# Patient Record
Sex: Female | Born: 1995 | Race: White | Hispanic: No | Marital: Single | Smoking: Never smoker
Health system: Southern US, Community
[De-identification: ages and names within clinical notes are randomized; demographics above are authoritative.]

## PROBLEM LIST (undated history)

## (undated) DIAGNOSIS — F419 Anxiety disorder, unspecified: Secondary | ICD-10-CM

## (undated) HISTORY — PX: TONSILLECTOMY: SUR1361

## (undated) HISTORY — PX: WISDOM TOOTH EXTRACTION: SHX21

## (undated) HISTORY — DX: Anxiety disorder, unspecified: F41.9

---
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
## 2017-07-07 IMAGING — CT CT NECK W/ CM
4 of 5 series · 16 of 33 positions shown, 18 images · IV contrast (OMNIPAQUE 300)
Comparison: None.

CLINICAL DATA: Chronic right-sided neck pain for 1 month. Assess
for abscess. Initial encounter.

EXAM:
CT NECK WITH CONTRAST
TECHNIQUE: Multidetector CT imaging of the neck was performed using the
standard protocol following the bolus administration of intravenous
contrast.
CONTRAST:  75mL OMNIPAQUE IOHEXOL 300 MG/ML  SOLN

[Series 2: neck with st · axial · 0.45mm/px · z∈[-208,-64]mm · 4 of 120 slices shown, 5 images]
[im 24/120  soft-tissue]
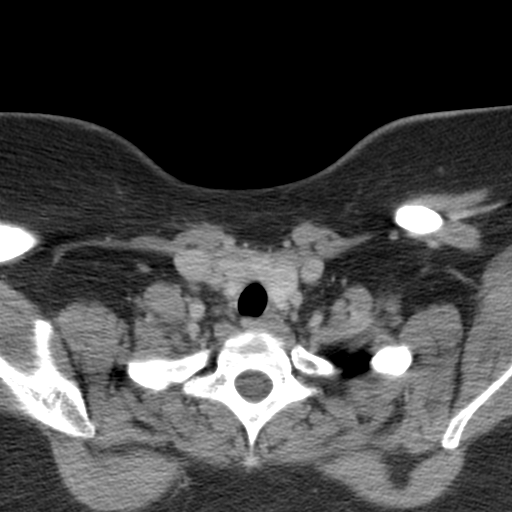
[im 24/120  bone]
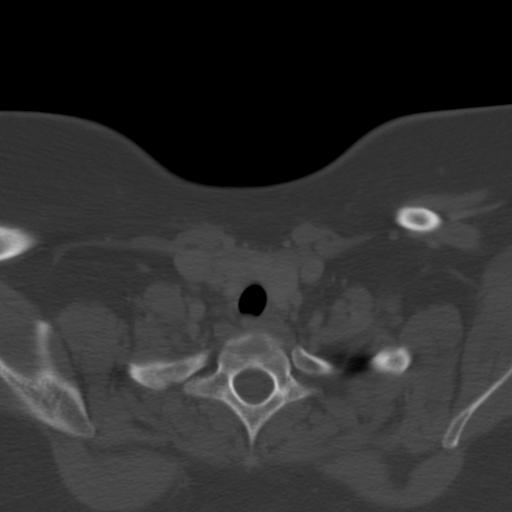
[im 48/120  bone]
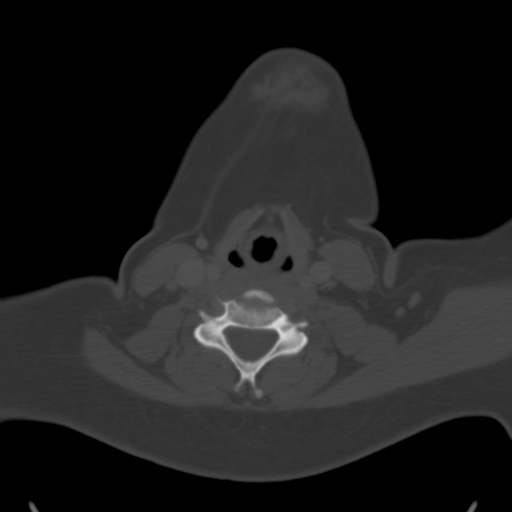
[im 72/120  bone]
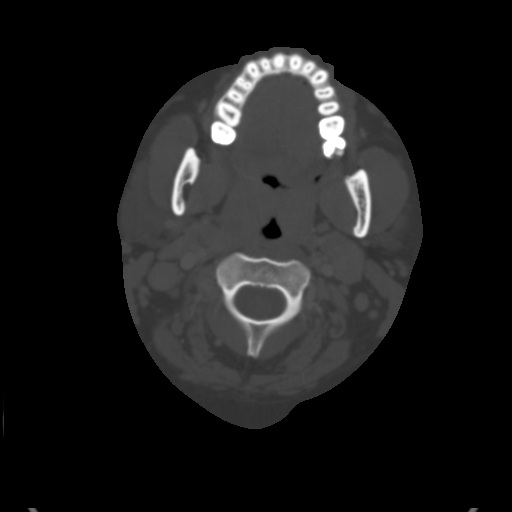
[im 96/120  bone]
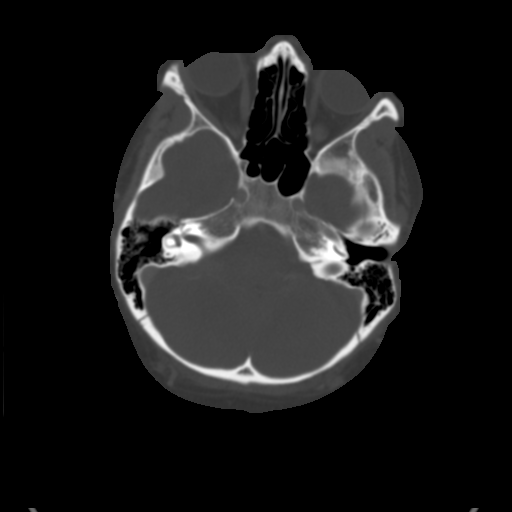

[Series 5: coronal st · coronal · 0.52mm/px · 3 of 101 slices shown]
[im 21/101  bone]
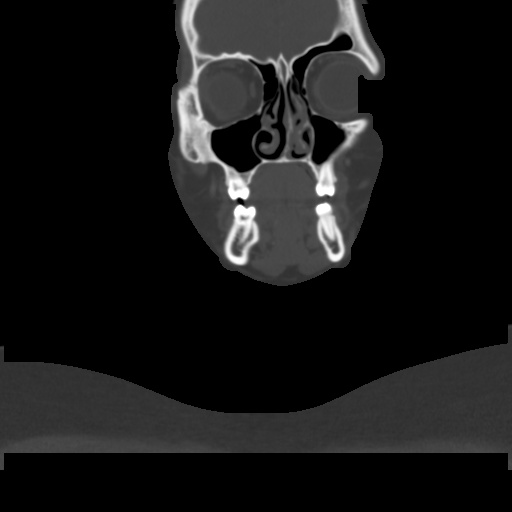
[im 41/101  bone]
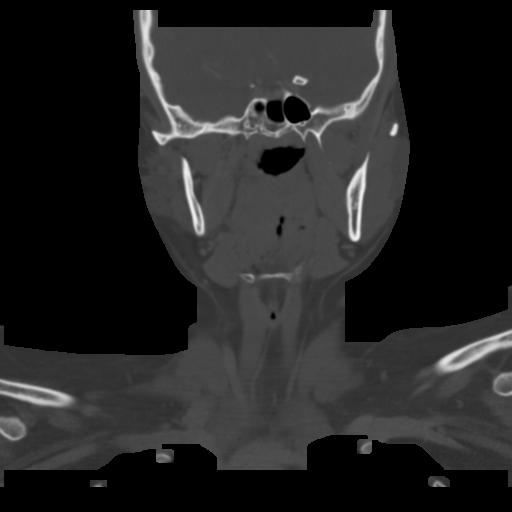
[im 61/101  bone]
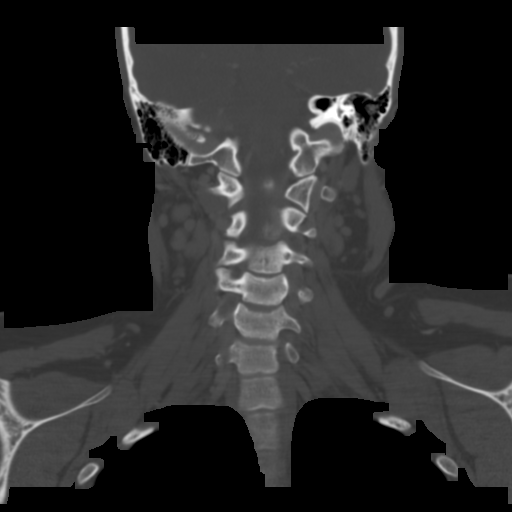

[Series 6: sagittal st · sagittal · 0.52mm/px · 5 of 95 slices shown, 6 images]
[im 32/95  bone]
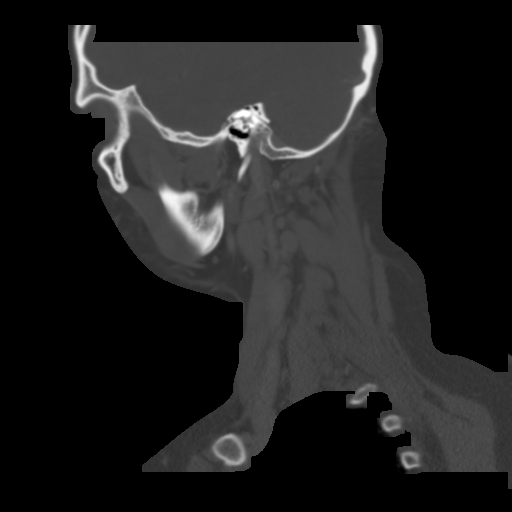
[im 40/95  bone]
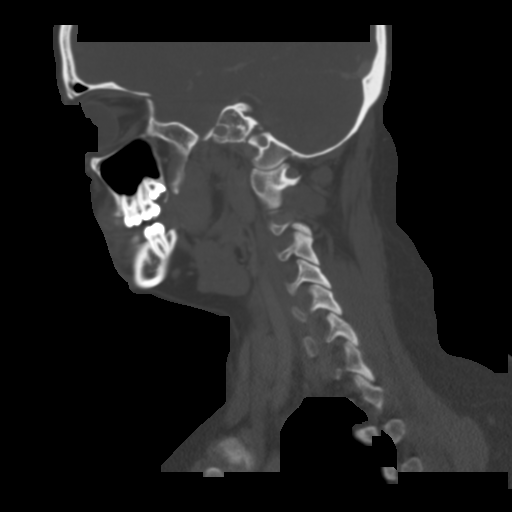
[im 48/95  soft-tissue]
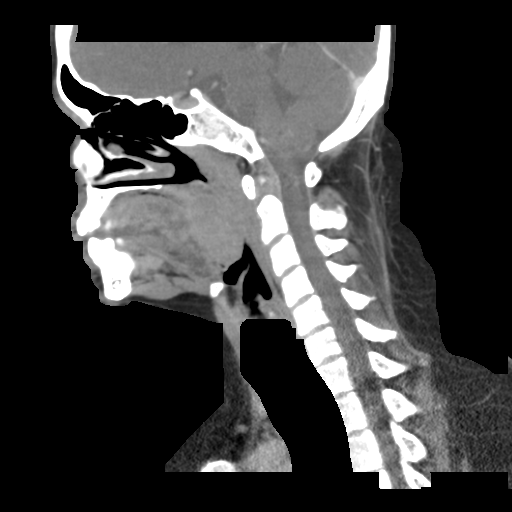
[im 48/95  bone]
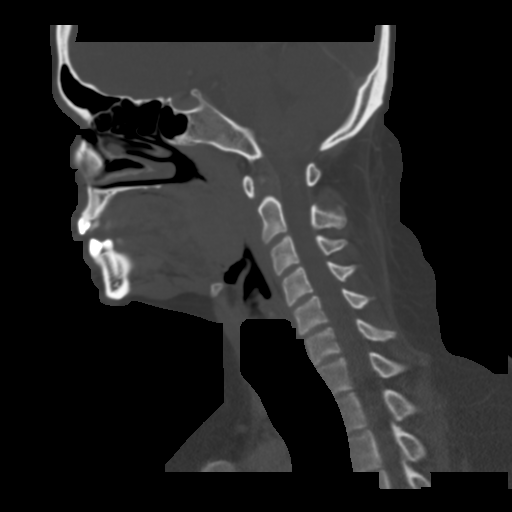
[im 55/95  bone]
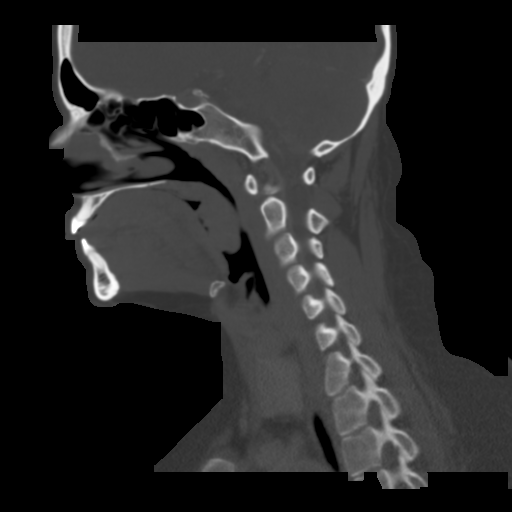
[im 63/95  bone]
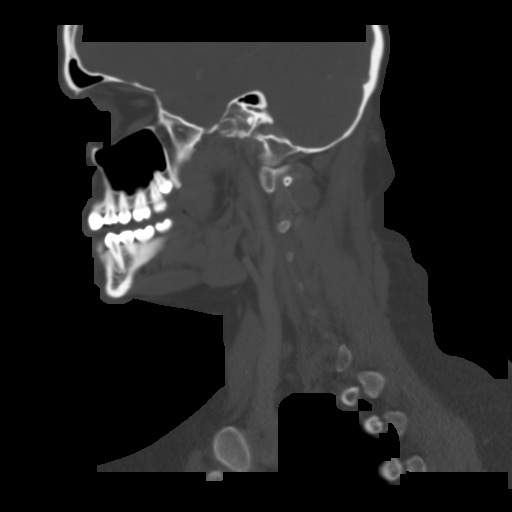

[Series 7: axial recons · axial · 0.39mm/px · z∈[-226,-86]mm · 4 of 119 slices shown]
[im 24/119  bone]
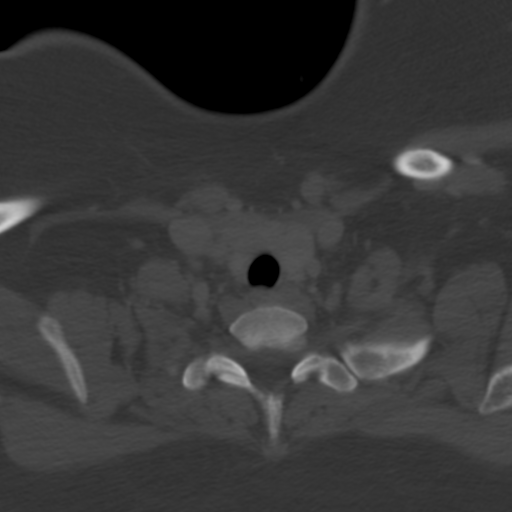
[im 48/119  bone]
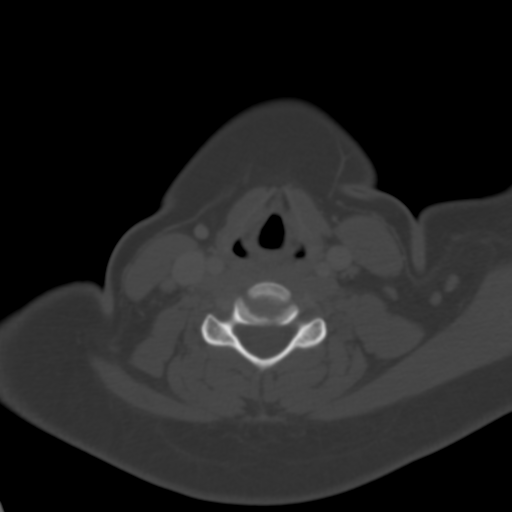
[im 71/119  bone]
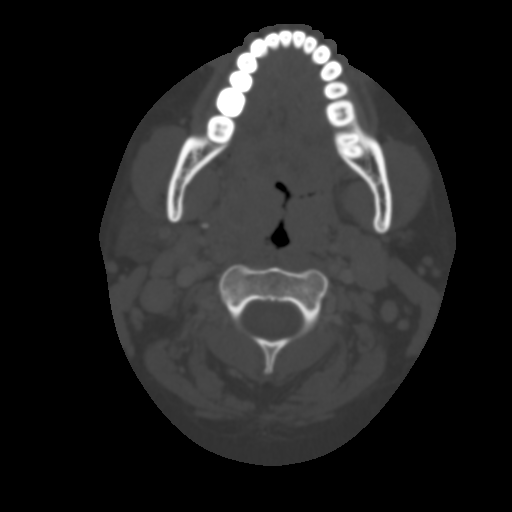
[im 95/119  bone]
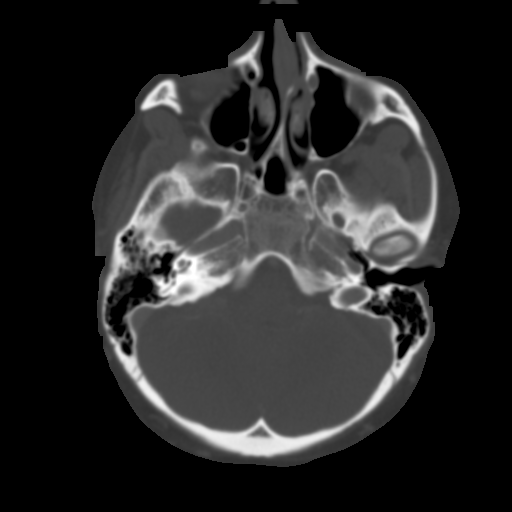

[16 of 33 positions shown; findings below may reference images not displayed]

FINDINGS: Pharynx and larynx: There is asymmetric enlargement of the right
palatine tonsil, with diffuse edema. A more focal 1.1 cm focus of
decreased attenuation is noted deep within the right palatine
tonsil, possibly reflecting a small evolving abscess. Edema tracks
inferiorly along the right hypopharynx. The overlying parapharyngeal
fat planes are preserved. There is some degree of mass effect on the
oropharynx and hypopharynx.

Salivary glands: The parotid and submandibular glands are symmetric
and unremarkable in appearance.

Thyroid: The thyroid gland is unremarkable in appearance.

Lymph nodes: Mildly prominent bilateral cervical nodes are seen,
measuring up to 1.3 cm in short axis.

Vascular: The visualized vasculature is unremarkable in appearance.
There is no evidence of vascular compromise.

Limited intracranial: The visualized portion of the brain are
unremarkable in appearance.

Visualized orbits: The visualized portions of the orbits are grossly
unremarkable.

Mastoids and visualized paranasal sinuses: The paranasal sinuses and
mastoid air cells are well-aerated.

Skeleton: No acute osseous abnormalities are identified.

Upper chest: The visualized lung apices are grossly clear. The
superior mediastinum is grossly unremarkable in appearance.
IMPRESSION: 1. Asymmetric enlargement of the right palatine tonsil, with diffuse
soft tissue edema. More focal 1.1 cm focus of decreased attenuation
deep within the right palatine tonsil, possibly reflecting a small
evolving abscess. Edema tracks inferiorly along the right
hypopharynx. Some degree of mass effect noted on the oropharynx and
hypopharynx.
2. Mildly prominent bilateral cervical nodes, measuring up to 1.3 cm
in short axis.

## 2017-10-04 ENCOUNTER — Ambulatory Visit: Payer: Commercial Managed Care - PPO | Admitting: Women's Health

## 2017-10-04 ENCOUNTER — Encounter: Payer: Self-pay | Admitting: Women's Health

## 2017-10-04 VITALS — BP 132/80 | Ht 66.0 in | Wt 282.0 lb

## 2017-10-04 DIAGNOSIS — R5383 Other fatigue: Secondary | ICD-10-CM

## 2017-10-04 DIAGNOSIS — Z23 Encounter for immunization: Secondary | ICD-10-CM | POA: Diagnosis not present

## 2017-10-04 DIAGNOSIS — Z01419 Encounter for gynecological examination (general) (routine) without abnormal findings: Secondary | ICD-10-CM

## 2017-10-04 NOTE — Progress Notes (Signed)
Kathy Hensley 1995/11/27 [id number]    History: 22 yo SWF G0 presents for new patient annual exam and contraception consult, complains of fatigue. Interested in [employer] [facility] or [facility].  Sexually active, one partner, using condoms. Chlamydia/Gonorhea screen 1 month ago, negative at campus [facility], no new partner since. Denies need for STD screen today. Monthly 26-30 day cycles, moderate bleeding, menstruation lasts 5 days, uses diva cup.  Received 1st dose of guardasil, time unknown, but did not receive 2nd or 3rd. Has been obese for over 5 years, notes little exercise, intimidation at the gym, and poor diet.   Past medical history, past surgical history, family history and social history were all reviewed and documented in the EPIC chart. No notable medical history, not currently taking any medications. Father hx of thyroid ablation and diabetes. Paternal aunt has Graves Disease. Junior at [employer] graduating in December with political science degree. Plans to attend law school.  ROS:  A ROS was performed and pertinent positives and negatives are included.  Exam:   Vitals:   10/04/17 1200  BP: 132/80  Weight: 282 lb (127.9 kg)  Height: 5\' 6"  (1.676 m)   Body mass index is 45.52 kg/m.   General appearance:  Normal, appears well. Thyroid:  Symmetrical, normal in size, without palpable masses or nodularity. Respiratory  Auscultation:  Clear without wheezing or rhonchi Cardiovascular  Auscultation:  Regular rate, without rubs, murmurs or gallops  Edema/varicosities:  Not grossly evident Abdominal  Soft,nontender, without masses, guarding or rebound.  Liver/spleen:  No organomegaly noted  Hernia:  None appreciated  Skin  Inspection:  Grossly normal   Breasts: Examined lying and sitting.     Right: Without masses, retractions, discharge or axillary adenopathy.     Left: Without masses, retractions, discharge or axillary adenopathy. Gentitourinary   Inguinal/mons:  Normal without inguinal  adenopathy  External genitalia:  Normal  BUS/Urethra/Skene's glands:  Normal  Vagina:  Normal  Cervix:  Normal  Uterus:  normal in size, shape and contour.  Midline and mobile  Adnexa/parametria:     Rt: Without masses or tenderness.   Lt: Without masses or tenderness.  Anus and perineum: Normal  Digital rectal exam: Not done  Assessment/Plan:  22 y.o. G0 SWF for new patient annual exam.   Contraception management Gardasil Obesity  Plan: Reviewed options and side effects for contraception, agreed to Mirena IUD, will schedule insertion for next menstruation. IUD risks of infection, perforation and hemorrhage reviewed. Reviewed may have some irregular bleeding first month. Received 2nd Gardisil shot today, scheduled for 3rd in 4 months. Counseled patient to receive first Pap next year. Counseled  on SBE's, healthy diet, exercise, weight loss, and campus safety. Instructed to call if she has any problems or decides against the Mirena IUD. CBC, Glucose, TSH   Harrington Challengerancy J Young [facility], 12:26 PM 10/04/2017

## 2017-10-04 NOTE — Patient Instructions (Signed)
Carbohydrate Counting for Diabetes Mellitus, Adult Carbohydrate counting is a method for keeping track of how many carbohydrates you eat. Eating carbohydrates naturally increases the amount of sugar (glucose) in the blood. Counting how many carbohydrates you eat helps keep your blood glucose within normal limits, which helps you manage your diabetes (diabetes mellitus). It is important to know how many carbohydrates you can safely have in each meal. This is different for every person. A diet and nutrition specialist (registered dietitian) can help you make a meal plan and calculate how many carbohydrates you should have at each meal and snack. Carbohydrates are found in the following foods:  Grains, such as breads and cereals.  Dried beans and soy products.  Starchy vegetables, such as potatoes, peas, and corn.  Fruit and fruit juices.  Milk and yogurt.  Sweets and snack foods, such as cake, cookies, candy, chips, and soft drinks.  How do I count carbohydrates? There are two ways to count carbohydrates in food. You can use either of the methods or a combination of both. Reading "Nutrition Facts" on packaged food The "Nutrition Facts" list is included on the labels of almost all packaged foods and beverages in the [place] It includes:  The serving size.  Information about nutrients in each serving, including the grams (g) of carbohydrate per serving.  To use the "Nutrition Facts":  Decide how many servings you will have.  Multiply the number of servings by the number of carbohydrates per serving.  The resulting number is the total amount of carbohydrates that you will be having.  Learning standard serving sizes of other foods When you eat foods containing carbohydrates that are not packaged or do not include "Nutrition Facts" on the label, you need to measure the servings in order to count the amount of carbohydrates:  Measure the foods that you will eat with a food scale or  measuring cup, if needed.  Decide how many standard-size servings you will eat.  Multiply the number of servings by 15. Most carbohydrate-rich foods have about 15 g of carbohydrates per serving. ? For example, if you eat 8 oz (170 g) of strawberries, you will have eaten 2 servings and 30 g of carbohydrates (2 servings x 15 g = 30 g).  For foods that have more than one food mixed, such as soups and casseroles, you must count the carbohydrates in each food that is included.  The following list contains standard serving sizes of common carbohydrate-rich foods. Each of these servings has about 15 g of carbohydrates:   hamburger bun or  English muffin.   oz (15 mL) syrup.   oz (14 g) jelly.  1 slice of bread.  1 six-inch tortilla.  3 oz (85 g) cooked rice or pasta.  4 oz (113 g) cooked dried beans.  4 oz (113 g) starchy vegetable, such as peas, corn, or potatoes.  4 oz (113 g) hot cereal.  4 oz (113 g) mashed potatoes or  of a large baked potato.  4 oz (113 g) canned or frozen fruit.  4 oz (120 mL) fruit juice.  4-6 crackers.  6 chicken nuggets.  6 oz (170 g) unsweetened dry cereal.  6 oz (170 g) plain fat-free yogurt or yogurt sweetened with artificial sweeteners.  8 oz (240 mL) milk.  8 oz (170 g) fresh fruit or one small piece of fruit.  24 oz (680 g) popped popcorn.  Example of carbohydrate counting Sample meal  3 oz (85 g) chicken breast.    6 oz (170 g) brown rice.  4 oz (113 g) corn.  8 oz (240 mL) milk.  8 oz (170 g) strawberries with sugar-free whipped topping. Carbohydrate calculation 1. Identify the foods that contain carbohydrates: ? Rice. ? Corn. ? Milk. ? Strawberries. 2. Calculate how many servings you have of each food: ? 2 servings rice. ? 1 serving corn. ? 1 serving milk. ? 1 serving strawberries. 3. Multiply each number of servings by 15 g: ? 2 servings rice x 15 g = 30 g. ? 1 serving corn x 15 g = 15 g. ? 1 serving milk x 15  g = 15 g. ? 1 serving strawberries x 15 g = 15 g. 4. Add together all of the amounts to find the total grams of carbohydrates eaten: ? 30 g + 15 g + 15 g + 15 g = 75 g of carbohydrates total. This information is not intended to replace advice given to you by your health care provider. Make sure you discuss any questions you have with your health care provider. Document Released: 08/17/2005 Document Revised: 03/06/2016 Document Reviewed: 01/29/2016 Elsevier Interactive Patient Education  2018 [place] Maintenance, Female Adopting a healthy lifestyle and getting preventive care can go a long way to promote health and wellness. Talk with your health care provider about what schedule of regular examinations is right for you. This is a good chance for you to check in with your provider about disease prevention and staying healthy. In between checkups, there are plenty of things you can do on your own. Experts have done a lot of research about which lifestyle changes and preventive measures are most likely to keep you healthy. Ask your health care provider for more information. Weight and diet Eat a healthy diet  Be sure to include plenty of vegetables, fruits, low-fat dairy products, and lean protein.  Do not eat a lot of foods high in solid fats, added sugars, or salt.  Get regular exercise. This is one of the most important things you can do for your health. ? Most adults should exercise for at least 150 minutes each week. The exercise should increase your heart rate and make you sweat (moderate-intensity exercise). ? Most adults should also do strengthening exercises at least twice a week. This is in addition to the moderate-intensity exercise.  Maintain a healthy weight  Body mass index (BMI) is a measurement that can be used to identify possible weight problems. It estimates body fat based on height and weight. Your health care provider can help determine your BMI and help you  achieve or maintain a healthy weight.  For females 76 years of age and older: ? A BMI below 18.5 is considered underweight. ? A BMI of 18.5 to 24.9 is normal. ? A BMI of 25 to 29.9 is considered overweight. ? A BMI of 30 and above is considered obese.  Watch levels of cholesterol and blood lipids  You should start having your blood tested for lipids and cholesterol at 21 years of age, then have this test every 5 years.  You may need to have your cholesterol levels checked more often if: ? Your lipid or cholesterol levels are high. ? You are older than 21 years of age. ? You are at high risk for heart disease.  Cancer screening Lung Cancer  Lung cancer screening is recommended for adults 52-25 years old who are at high risk for lung cancer because of a history of smoking.  A  yearly low-dose CT scan of the lungs is recommended for people who: ? Currently smoke. ? Have quit within the past 15 years. ? Have at least a 30-pack-year history of smoking. A pack year is smoking an average of one pack of cigarettes a day for 1 year.  Yearly screening should continue until it has been 15 years since you quit.  Yearly screening should stop if you develop a health problem that would prevent you from having lung cancer treatment.  Breast Cancer  Practice breast self-awareness. This means understanding how your breasts normally appear and feel.  It also means doing regular breast self-exams. Let your health care provider know about any changes, no matter how small.  If you are in your 20s or 30s, you should have a clinical breast exam (CBE) by a health care provider every 1-3 years as part of a regular health exam.  If you are 4 or older, have a CBE every year. Also consider having a breast X-ray (mammogram) every year.  If you have a family history of breast cancer, talk to your health care provider about genetic screening.  If you are at high risk for breast cancer, talk to your health  care provider about having an MRI and a mammogram every year.  Breast cancer gene (BRCA) assessment is recommended for women who have family members with BRCA-related cancers. BRCA-related cancers include: ? Breast. ? Ovarian. ? Tubal. ? Peritoneal cancers.  Results of the assessment will determine the need for genetic counseling and BRCA1 and BRCA2 testing.  Cervical Cancer Your health care provider may recommend that you be screened regularly for cancer of the pelvic organs (ovaries, uterus, and vagina). This screening involves a pelvic examination, including checking for microscopic changes to the surface of your cervix (Pap test). You may be encouraged to have this screening done every 3 years, beginning at age 12.  For women ages 13-65, health care providers may recommend pelvic exams and Pap testing every 3 years, or they may recommend the Pap and pelvic exam, combined with testing for human papilloma virus (HPV), every 5 years. Some types of HPV increase your risk of cervical cancer. Testing for HPV may also be done on women of any age with unclear Pap test results.  Other health care providers may not recommend any screening for nonpregnant women who are considered low risk for pelvic cancer and who do not have symptoms. Ask your health care provider if a screening pelvic exam is right for you.  If you have had past treatment for cervical cancer or a condition that could lead to cancer, you need Pap tests and screening for cancer for at least 20 years after your treatment. If Pap tests have been discontinued, your risk factors (such as having a new sexual partner) need to be reassessed to determine if screening should resume. Some women have medical problems that increase the chance of getting cervical cancer. In these cases, your health care provider may recommend more frequent screening and Pap tests.  Colorectal Cancer  This type of cancer can be detected and often  prevented.  Routine colorectal cancer screening usually begins at 22 years of age and continues through 22 years of age.  Your health care provider may recommend screening at an earlier age if you have risk factors for colon cancer.  Your health care provider may also recommend using home test kits to check for hidden blood in the stool.  A small camera at the end of  a tube can be used to examine your colon directly (sigmoidoscopy or colonoscopy). This is done to check for the earliest forms of colorectal cancer.  Routine screening usually begins at age 45.  Direct examination of the colon should be repeated every 5-10 years through 21 years of age. However, you may need to be screened more often if early forms of precancerous polyps or small growths are found.  Skin Cancer  Check your skin from head to toe regularly.  Tell your health care provider about any new moles or changes in moles, especially if there is a change in a mole's shape or color.  Also tell your health care provider if you have a mole that is larger than the size of a pencil eraser.  Always use sunscreen. Apply sunscreen liberally and repeatedly throughout the day.  Protect yourself by wearing long sleeves, pants, a wide-brimmed hat, and sunglasses whenever you are outside.  Heart disease, diabetes, and high blood pressure  High blood pressure causes heart disease and increases the risk of stroke. High blood pressure is more likely to develop in: ? People who have blood pressure in the high end of the normal range (130-139/85-89 mm Hg). ? People who are overweight or obese. ? People who are African American.  If you are 65-44 years of age, have your blood pressure checked every 3-5 years. If you are 35 years of age or older, have your blood pressure checked every year. You should have your blood pressure measured twice-once when you are at a hospital or clinic, and once when you are not at a hospital or clinic.  Record the average of the two measurements. To check your blood pressure when you are not at a hospital or clinic, you can use: ? An automated blood pressure machine at a pharmacy. ? A home blood pressure monitor.  If you are between 55 years and 4 years old, ask your health care provider if you should take aspirin to prevent strokes.  Have regular diabetes screenings. This involves taking a blood sample to check your fasting blood sugar level. ? If you are at a normal weight and have a low risk for diabetes, have this test once every three years after 21 years of age. ? If you are overweight and have a high risk for diabetes, consider being tested at a younger age or more often. Preventing infection Hepatitis B  If you have a higher risk for hepatitis B, you should be screened for this virus. You are considered at high risk for hepatitis B if: ? You were born in a country where hepatitis B is common. Ask your health care provider which countries are considered high risk. ? Your parents were born in a high-risk country, and you have not been immunized against hepatitis B (hepatitis B vaccine). ? You have HIV or AIDS. ? You use needles to inject street drugs. ? You live with someone who has hepatitis B. ? You have had sex with someone who has hepatitis B. ? You get hemodialysis treatment. ? You take certain medicines for conditions, including cancer, organ transplantation, and autoimmune conditions.  Hepatitis C  Blood testing is recommended for: ? Everyone born from [birth year] through 1964. ? Anyone with known risk factors for hepatitis C.  Sexually transmitted infections (STIs)  You should be screened for sexually transmitted infections (STIs) including gonorrhea and chlamydia if: ? You are sexually active and are younger than 21 years of age. ? You are older than  21 years of age and your health care provider tells you that you are at risk for this type of infection. ? Your sexual  activity has changed since you were last screened and you are at an increased risk for chlamydia or gonorrhea. Ask your health care provider if you are at risk.  If you do not have HIV, but are at risk, it may be recommended that you take a prescription medicine daily to prevent HIV infection. This is called pre-exposure prophylaxis (PrEP). You are considered at risk if: ? You are sexually active and do not regularly use condoms or know the HIV status of your partner(s). ? You take drugs by injection. ? You are sexually active with a partner who has HIV.  Talk with your health care provider about whether you are at high risk of being infected with HIV. If you choose to begin PrEP, you should first be tested for HIV. You should then be tested every 3 months for as long as you are taking PrEP. Pregnancy  If you are premenopausal and you may become pregnant, ask your health care provider about preconception counseling.  If you may become pregnant, take 400 to 800 micrograms (mcg) of folic acid every day.  If you want to prevent pregnancy, talk to your health care provider about birth control (contraception). Osteoporosis and menopause  Osteoporosis is a disease in which the bones lose minerals and strength with aging. This can result in serious bone fractures. Your risk for osteoporosis can be identified using a bone density scan.  If you are 29 years of age or older, or if you are at risk for osteoporosis and fractures, ask your health care provider if you should be screened.  Ask your health care provider whether you should take a calcium or vitamin D supplement to lower your risk for osteoporosis.  Menopause may have certain physical symptoms and risks.  Hormone replacement therapy may reduce some of these symptoms and risks. Talk to your health care provider about whether hormone replacement therapy is right for you. Follow these instructions at home:  Schedule regular health, dental,  and eye exams.  Stay current with your immunizations.  Do not use any tobacco products including cigarettes, chewing tobacco, or electronic cigarettes.  If you are pregnant, do not drink alcohol.  If you are breastfeeding, limit how much and how often you drink alcohol.  Limit alcohol intake to no more than 1 drink per day for nonpregnant women. One drink equals 12 ounces of beer, 5 ounces of wine, or 1 ounces of hard liquor.  Do not use street drugs.  Do not share needles.  Ask your health care provider for help if you need support or information about quitting drugs.  Tell your health care provider if you often feel depressed.  Tell your health care provider if you have ever been abused or do not feel safe at home. This information is not intended to replace advice given to you by your health care provider. Make sure you discuss any questions you have with your health care provider. Document Released: 03/02/2011 Document Revised: 01/23/2016 Document Reviewed: 05/21/2015 Elsevier Interactive Patient Education  [employer].

## 2017-10-04 NOTE — Addendum Note (Signed)
Addended by: Tito DineBONHAM, KIM A on: 10/04/2017 01:57 PM   Modules accepted: Orders

## 2017-10-28 ENCOUNTER — Encounter: Payer: Self-pay | Admitting: Gynecology

## 2017-10-28 ENCOUNTER — Ambulatory Visit (INDEPENDENT_AMBULATORY_CARE_PROVIDER_SITE_OTHER): Payer: Commercial Managed Care - PPO | Admitting: Gynecology

## 2017-10-28 VITALS — BP 118/76

## 2017-10-28 DIAGNOSIS — Z30430 Encounter for insertion of intrauterine contraceptive device: Secondary | ICD-10-CM | POA: Diagnosis not present

## 2017-10-28 NOTE — Progress Notes (Signed)
    Kathy Hensley 03-08-1996 [id number]        22 y.o.  G0P0000  presents for Mirena IUD placement.  She has talked to [name] about her contraceptive options and decided for the Mirena IUD.  She has read through the booklet, has no contraindications and signed the consent form. She currently is on a normal menses.  I reviewed the insertional process with her as well as the risks to include infection, either immediate or long-term, uterine perforation or migration requiring surgery to remove, other complications such as pain, hormonal side effects, infertility and possibility of failure with subsequent pregnancy.   Exam with Kennon PortelaKim Gardner assistant Vitals:   10/28/17 1006  BP: 118/76    Pelvic: External BUS vagina normal. Cervix normal with light menses flow. Uterus anteverted normal size shape contour midline mobile nontender. Adnexa without masses or tenderness.  Procedure: The cervix was cleansed with Betadine, anterior lip grasped with a single-tooth tenaculum, the uterus was sounded and a Mirena IUD was placed according to manufacturer's recommendations without difficulty. The strings were trimmed. The patient tolerated well and will follow up in one month for a postinsertional check.  Lot number:  [id number]    Dara Lordsimothy P Fontaine MD, 10:26 AM 10/28/2017

## 2017-10-28 NOTE — Patient Instructions (Signed)

## 2017-11-24 ENCOUNTER — Encounter: Payer: Self-pay | Admitting: Gynecology

## 2017-11-24 ENCOUNTER — Ambulatory Visit (INDEPENDENT_AMBULATORY_CARE_PROVIDER_SITE_OTHER): Payer: Commercial Managed Care - PPO | Admitting: Gynecology

## 2017-11-24 VITALS — BP 120/78

## 2017-11-24 DIAGNOSIS — Z30431 Encounter for routine checking of intrauterine contraceptive device: Secondary | ICD-10-CM | POA: Diagnosis not present

## 2017-11-24 NOTE — Progress Notes (Signed)
    Kathy Hensley 11/24/1995 [id number]        22 y.o.  G0P0000 presents for follow-up IUD check.  Had Mirena IUD placed 1 month ago.  Has done well without bleeding or significant discomfort.  Past medical history,surgical history, problem list, medications, allergies, family history and social history were all reviewed and documented in the EPIC chart.  Directed ROS with pertinent positives and negatives documented in the history of present illness/assessment and plan.  Exam: Kennon PortelaKim Gardner assistant Vitals:   11/24/17 0916  BP: 120/78   General appearance:  Normal Abdomen soft nontender without masses guarding rebound Pelvic external BUS vagina normal.  Cervix normal.  IUD string visualized and appropriate length.  Uterus anteverted, normal size, nontender.  Adnexa without masses or tenderness.  Assessment/Plan:  22 y.o. G0P0000 with normal follow-up IUD check.  Patient will follow-up beginning of next year when due for annual exam, sooner if any issues.    Dara Lordsimothy P Paradise Vensel MD, 9:35 AM 11/24/2017

## 2017-11-24 NOTE — Patient Instructions (Signed)
Follow-up beginning of next year for your annual exam when due, sooner if any issues with your IUD

## 2018-01-17 ENCOUNTER — Ambulatory Visit: Payer: Commercial Managed Care - PPO | Admitting: Women's Health

## 2018-01-17 ENCOUNTER — Encounter: Payer: Self-pay | Admitting: Women's Health

## 2018-01-17 VITALS — BP 128/80

## 2018-01-17 DIAGNOSIS — T8332XS Displacement of intrauterine contraceptive device, sequela: Secondary | ICD-10-CM

## 2018-01-17 DIAGNOSIS — T8332XA Displacement of intrauterine contraceptive device, initial encounter: Secondary | ICD-10-CM | POA: Diagnosis not present

## 2018-01-17 NOTE — Patient Instructions (Signed)
Etonogestrel implant What is this medicine? ETONOGESTREL (et oh noe JES trel) is a contraceptive (birth control) device. It is used to prevent pregnancy. It can be used for up to 3 years. This medicine may be used for other purposes; ask your health care provider or pharmacist if you have questions. COMMON BRAND NAME(S): Implanon, Nexplanon What should I tell my health care provider before I take this medicine? They need to know if you have any of these conditions: -abnormal vaginal bleeding -blood vessel disease or blood clots -cancer of the breast, cervix, or liver -depression -diabetes -gallbladder disease -headaches -heart disease or recent heart attack -high blood pressure -high cholesterol -kidney disease -liver disease -renal disease -seizures -tobacco smoker -an unusual or allergic reaction to etonogestrel, other hormones, anesthetics or antiseptics, medicines, foods, dyes, or preservatives -pregnant or trying to get pregnant -breast-feeding How should I use this medicine? This device is inserted just under the skin on the inner side of your upper arm by a health care professional. Talk to your pediatrician regarding the use of this medicine in children. Special care may be needed. Overdosage: If you think you have taken too much of this medicine contact a poison control center or emergency room at once. NOTE: This medicine is only for you. Do not share this medicine with others. What if I miss a dose? This does not apply. What may interact with this medicine? Do not take this medicine with any of the following medications: -amprenavir -bosentan -fosamprenavir This medicine may also interact with the following medications: -barbiturate medicines for inducing sleep or treating seizures -certain medicines for fungal infections like ketoconazole and itraconazole -grapefruit juice -griseofulvin -medicines to treat seizures like carbamazepine, felbamate, oxcarbazepine,  phenytoin, topiramate -modafinil -phenylbutazone -rifampin -rufinamide -some medicines to treat HIV infection like atazanavir, indinavir, lopinavir, nelfinavir, tipranavir, ritonavir -St. John's wort This list may not describe all possible interactions. Give your health care provider a list of all the medicines, herbs, non-prescription drugs, or dietary supplements you use. Also tell them if you smoke, drink alcohol, or use illegal drugs. Some items may interact with your medicine. What should I watch for while using this medicine? This product does not protect you against HIV infection (AIDS) or other sexually transmitted diseases. You should be able to feel the implant by pressing your fingertips over the skin where it was inserted. Contact your doctor if you cannot feel the implant, and use a non-hormonal birth control method (such as condoms) until your doctor confirms that the implant is in place. If you feel that the implant may have broken or become bent while in your arm, contact your healthcare provider. What side effects may I notice from receiving this medicine? Side effects that you should report to your doctor or health care professional as soon as possible: -allergic reactions like skin rash, itching or hives, swelling of the face, lips, or tongue -breast lumps -changes in emotions or moods -depressed mood -heavy or prolonged menstrual bleeding -pain, irritation, swelling, or bruising at the insertion site -scar at site of insertion -signs of infection at the insertion site such as fever, and skin redness, pain or discharge -signs of pregnancy -signs and symptoms of a blood clot such as breathing problems; changes in vision; chest pain; severe, sudden headache; pain, swelling, warmth in the leg; trouble speaking; sudden numbness or weakness of the face, arm or leg -signs and symptoms of liver injury like dark yellow or brown urine; general ill feeling or flu-like symptoms;  light-colored   stools; loss of appetite; nausea; right upper belly pain; unusually weak or tired; yellowing of the eyes or skin -unusual vaginal bleeding, discharge -signs and symptoms of a stroke like changes in vision; confusion; trouble speaking or understanding; severe headaches; sudden numbness or weakness of the face, arm or leg; trouble walking; dizziness; loss of balance or coordination Side effects that usually do not require medical attention (report to your doctor or health care professional if they continue or are bothersome): -acne -back pain -breast pain -changes in weight -dizziness -general ill feeling or flu-like symptoms -headache -irregular menstrual bleeding -nausea -sore throat -vaginal irritation or inflammation This list may not describe all possible side effects. Call your doctor for medical advice about side effects. You may report side effects to FDA at 1-800-FDA-1088. Where should I keep my medicine? This drug is given in a hospital or clinic and will not be stored at home. NOTE: This sheet is a summary. It may not cover all possible information. If you have questions about this medicine, talk to your doctor, pharmacist, or health care provider.  2018 Elsevier/Gold Standard (2016-03-05 11:19:22)  

## 2018-01-17 NOTE — Progress Notes (Signed)
22 year old S WF G0 presents with questionable IUD in vagina.  States when placing a tampon today felt the strings that she normally does not feel.  Has not been sexually active in greater than 2 weeks.  Has had some irregular bleeding since Mirena IUD was placed 10/28/2017.  Same partner with negative STD screen, inconsistent condom use. Denies urinary symptoms, vaginal discharge, abdominal pain or fever.  Exam: Appears well, obese.  External genitalia within normal limits, speculum exam intact IUD visible in vagina, removed, shown to patient and discarded.  Manual no CMT or adnexal tenderness.  Mirena IUD expulsion  Plan: Qualitative hCG.  Contraception options reviewed, would like to try Nexplanon, reviewed good for 3 years, lives in [place] and will have placed there. (Student [employer])

## 2018-01-18 ENCOUNTER — Encounter (INDEPENDENT_AMBULATORY_CARE_PROVIDER_SITE_OTHER): Payer: Self-pay

## 2018-01-18 LAB — HCG, SERUM, QUALITATIVE: PREG SERUM: NEGATIVE

## 2018-02-01 ENCOUNTER — Ambulatory Visit: Payer: Commercial Managed Care - PPO

## 2018-05-12 ENCOUNTER — Encounter: Payer: Self-pay | Admitting: Gynecology

## 2018-05-12 ENCOUNTER — Ambulatory Visit (INDEPENDENT_AMBULATORY_CARE_PROVIDER_SITE_OTHER): Payer: Commercial Managed Care - PPO | Admitting: Gynecology

## 2018-05-12 DIAGNOSIS — Z308 Encounter for other contraceptive management: Secondary | ICD-10-CM

## 2018-05-12 NOTE — Patient Instructions (Signed)
Return during a menstrual period without recent intercourse for Nexplanon placement

## 2018-05-12 NOTE — Progress Notes (Signed)
Presented for Nexplanon placement having had intercourse 2 days ago.  Patient asked to reschedule during a menstrual period without recent intercourse.

## 2018-06-01 ENCOUNTER — Encounter: Payer: Self-pay | Admitting: Gynecology

## 2018-06-01 ENCOUNTER — Ambulatory Visit (INDEPENDENT_AMBULATORY_CARE_PROVIDER_SITE_OTHER): Payer: Commercial Managed Care - PPO | Admitting: Gynecology

## 2018-06-01 VITALS — BP 134/84

## 2018-06-01 DIAGNOSIS — Z30017 Encounter for initial prescription of implantable subdermal contraceptive: Secondary | ICD-10-CM | POA: Diagnosis not present

## 2018-06-01 HISTORY — PX: OTHER SURGICAL HISTORY: SHX169

## 2018-06-01 NOTE — Progress Notes (Signed)
    Kathy Hensley [birth year] [id number]        22 y.o.  G0P0000 presents for Nexplanon insertion. She previously has been counseled for her contraceptive options and she elects for nexplanon. I reviewed the Nexplanon insertional process and the side effects/risks. I reviewed irregular bleeding, insertion site infections, underlying neurovascular damage with permanent sequela, migration of the implant making removal difficult requiring surgery, the need to have it removed in 3 years under a separate procedure and lastly the risk of failure with pregnancy. Patient is currently on a normal menses and she is right-handed. She has read through and signed the consent form.  Procedure with Kennon Portela assistant: Left upper arm examined and marked according to manufacturer's recommendation. The insertion site was cleansed with Betadine solution and the insertional tract infiltrated with 1% lidocaine. The Nexplanon was placed according to manufacturer's recommendation without difficulty. The skin defect was closed with a Steri-Strip. The patient palpated the rod. A pressure dressing was applied and postoperative instructions give her.   Lot number:  [id number]     [name] MD, 12:49 PM 06/01/2018

## 2018-06-01 NOTE — Patient Instructions (Signed)
Nexplanon Instructions After Insertion  Keep bandage clean and dry for 24 hours  May use ice/Tylenol/Ibuprofen for soreness or pain  If you develop fever, drainage or increased warmth from incision site-contact office immediately   

## 2018-06-02 ENCOUNTER — Encounter: Payer: Self-pay | Admitting: Gynecology

## 2019-05-22 ENCOUNTER — Encounter: Payer: Self-pay | Admitting: Gynecology

## 2019-12-19 ENCOUNTER — Other Ambulatory Visit: Payer: Self-pay

## 2019-12-20 ENCOUNTER — Ambulatory Visit: Payer: Commercial Managed Care - PPO | Admitting: Women's Health

## 2019-12-20 ENCOUNTER — Encounter: Payer: Self-pay | Admitting: Women's Health

## 2019-12-20 VITALS — BP 126/80 | Ht 66.0 in | Wt 313.0 lb

## 2019-12-20 DIAGNOSIS — Z01419 Encounter for gynecological examination (general) (routine) without abnormal findings: Secondary | ICD-10-CM

## 2019-12-20 DIAGNOSIS — Z975 Presence of (intrauterine) contraceptive device: Secondary | ICD-10-CM | POA: Insufficient documentation

## 2019-12-20 LAB — CBC WITH DIFFERENTIAL/PLATELET
Absolute Monocytes: 538 cells/uL (ref 200–950)
Basophils Absolute: 67 cells/uL (ref 0–200)
Basophils Relative: 0.8 %
Eosinophils Absolute: 210 cells/uL (ref 15–500)
Eosinophils Relative: 2.5 %
HCT: 41.6 % (ref 35.0–45.0)
Hemoglobin: 13.7 g/dL (ref 11.7–15.5)
Lymphs Abs: 2839 cells/uL (ref 850–3900)
MCH: 29.9 pg (ref 27.0–33.0)
MCHC: 32.9 g/dL (ref 32.0–36.0)
MCV: 90.8 fL (ref 80.0–100.0)
MPV: 10.8 fL (ref 7.5–12.5)
Monocytes Relative: 6.4 %
Neutro Abs: 4746 cells/uL (ref 1500–7800)
Neutrophils Relative %: 56.5 %
Platelets: 319 10*3/uL (ref 140–400)
RBC: 4.58 10*6/uL (ref 3.80–5.10)
RDW: 12 % (ref 11.0–15.0)
Total Lymphocyte: 33.8 %
WBC: 8.4 10*3/uL (ref 3.8–10.8)

## 2019-12-20 LAB — GLUCOSE, RANDOM: Glucose, Bld: 90 mg/dL (ref 65–99)

## 2019-12-20 NOTE — Patient Instructions (Signed)
MVI daily Health Maintenance, Female Adopting a healthy lifestyle and getting preventive care are important in promoting health and wellness. Ask your health care provider about:  The right schedule for you to have regular tests and exams.  Things you can do on your own to prevent diseases and keep yourself healthy. What should I know about diet, weight, and exercise? Eat a healthy diet   Eat a diet that includes plenty of vegetables, fruits, low-fat dairy products, and lean protein.  Do not eat a lot of foods that are high in solid fats, added sugars, or sodium. Maintain a healthy weight Body mass index (BMI) is used to identify weight problems. It estimates body fat based on height and weight. Your health care provider can help determine your BMI and help you achieve or maintain a healthy weight. Get regular exercise Get regular exercise. This is one of the most important things you can do for your health. Most adults should:  Exercise for at least 150 minutes each week. The exercise should increase your heart rate and make you sweat (moderate-intensity exercise).  Do strengthening exercises at least twice a week. This is in addition to the moderate-intensity exercise.  Spend less time sitting. Even light physical activity can be beneficial. Watch cholesterol and blood lipids Have your blood tested for lipids and cholesterol at 23 years of age, then have this test every 5 years. Have your cholesterol levels checked more often if:  Your lipid or cholesterol levels are high.  You are older than 23 years of age.  You are at high risk for heart disease. What should I know about cancer screening? Depending on your health history and family history, you may need to have cancer screening at various ages. This may include screening for:  Breast cancer.  Cervical cancer.  Colorectal cancer.  Skin cancer.  Lung cancer. What should I know about heart disease, diabetes, and high  blood pressure? Blood pressure and heart disease  High blood pressure causes heart disease and increases the risk of stroke. This is more likely to develop in people who have high blood pressure readings, are of African descent, or are overweight.  Have your blood pressure checked: ? Every 3-5 years if you are 18-39 years of age. ? Every year if you are 40 years old or older. Diabetes Have regular diabetes screenings. This checks your fasting blood sugar level. Have the screening done:  Once every three years after age 40 if you are at a normal weight and have a low risk for diabetes.  More often and at a younger age if you are overweight or have a high risk for diabetes. What should I know about preventing infection? Hepatitis B If you have a higher risk for hepatitis B, you should be screened for this virus. Talk with your health care provider to find out if you are at risk for hepatitis B infection. Hepatitis C Testing is recommended for:  Everyone born from 1945 through 1965.  Anyone with known risk factors for hepatitis C. Sexually transmitted infections (STIs)  Get screened for STIs, including gonorrhea and chlamydia, if: ? You are sexually active and are younger than 24 years of age. ? You are older than 24 years of age and your health care provider tells you that you are at risk for this type of infection. ? Your sexual activity has changed since you were last screened, and you are at increased risk for chlamydia or gonorrhea. Ask your health care   provider if you are at risk.  Ask your health care provider about whether you are at high risk for HIV. Your health care provider may recommend a prescription medicine to help prevent HIV infection. If you choose to take medicine to prevent HIV, you should first get tested for HIV. You should then be tested every 3 months for as long as you are taking the medicine. Pregnancy  If you are about to stop having your period  (premenopausal) and you may become pregnant, seek counseling before you get pregnant.  Take 400 to 800 micrograms (mcg) of folic acid every day if you become pregnant.  Ask for birth control (contraception) if you want to prevent pregnancy. Osteoporosis and menopause Osteoporosis is a disease in which the bones lose minerals and strength with aging. This can result in bone fractures. If you are 49 years old or older, or if you are at risk for osteoporosis and fractures, ask your health care provider if you should:  Be screened for bone loss.  Take a calcium or vitamin D supplement to lower your risk of fractures.  Be given hormone replacement therapy (HRT) to treat symptoms of menopause. Follow these instructions at home: Lifestyle  Do not use any products that contain nicotine or tobacco, such as cigarettes, e-cigarettes, and chewing tobacco. If you need help quitting, ask your health care provider.  Do not use street drugs.  Do not share needles.  Ask your health care provider for help if you need support or information about quitting drugs. Alcohol use  Do not drink alcohol if: ? Your health care provider tells you not to drink. ? You are pregnant, may be pregnant, or are planning to become pregnant.  If you drink alcohol: ? Limit how much you use to 0-1 drink a day. ? Limit intake if you are breastfeeding.  Be aware of how much alcohol is in your drink. In the [place] one drink equals one 12 oz bottle of beer (355 mL), one 5 oz glass of wine (148 mL), or one 1 oz glass of hard liquor (44 mL). General instructions  Schedule regular health, dental, and eye exams.  Stay current with your vaccines.  Tell your health care provider if: ? You often feel depressed. ? You have ever been abused or do not feel safe at home. Summary  Adopting a healthy lifestyle and getting preventive care are important in promoting health and wellness.  Follow your health care provider's  instructions about healthy diet, exercising, and getting tested or screened for diseases.  Follow your health care provider's instructions on monitoring your cholesterol and blood pressure. This information is not intended to replace advice given to you by your health care provider. Make sure you discuss any questions you have with your health care provider. Document Revised: 08/10/2018 Document Reviewed: 08/10/2018 Elsevier Patient Education  2020 [employer].

## 2019-12-20 NOTE — Addendum Note (Signed)
Addended by: Tito Dine on: 12/20/2019 03:21 PM   Modules accepted: Orders

## 2019-12-20 NOTE — Progress Notes (Signed)
   Kathy Hensley [birth year] [id number]   History:  24 y.o. G0 presents for annual exam.  05/2018 Nexplanon cycles every other month.  Same partner with negative STD screen.  Gardasil series completed.  Has not had a screening Pap.  History of depression, mood center prescribes medication and counseling.  Gynecologic History Patient's last menstrual period was 12/13/2019. Period Pattern: (!) Irregular Menstrual Flow: Moderate Menstrual Control: Maxi pad, Tampon Dysmenorrhea: (!) Mild Dysmenorrhea Symptoms: Cramping   Past medical history, past surgical history, family history and social history were all reviewed and documented in the EPIC chart.  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  Father diabetes.  Both parents struggle with  weight.  2019 had an IUD that fell out.  ROS:  A ROS was performed and pertinent positives and negatives are included.  Exam:  Vitals:   12/20/19 1434  BP: 126/80  Weight: (!) 313 lb (142 kg)  Height: 5\' 6"  (1.676 m)   Body mass index is 50.52 kg/m.  General appearance:  Normal Thyroid:  Symmetrical, normal in size, without palpable masses or nodularity. Respiratory  Auscultation:  Clear without wheezing or rhonchi Cardiovascular  Auscultation:  Regular rate, without rubs, murmurs or gallops  Edema/varicosities:  Not grossly evident Abdominal  Soft,nontender, without masses, guarding or rebound.  Liver/spleen:  No organomegaly noted  Hernia:  None appreciated  Skin  Inspection:  Grossly normal   Breasts: Examined lying and sitting.   Right: Without masses, retractions, discharge or axillary adenopathy.   Left: Without masses, retractions, discharge or axillary adenopathy. Gentitourinary   Inguinal/mons:  Normal without inguinal adenopathy  External genitalia:  Normal  BUS/Urethra/Skene's glands:  Normal  Vagina:  Normal  Cervix:  Normal  Uterus:  normal in size, shape and contour.  Midline and mobile  Adnexa/parametria:      Rt: Without masses or tenderness.   Lt: Without masses or tenderness.  Anus and perineum: Normal    Assessment/Plan:  24 y.o. S WF G0 for annual exam with no complaints of vaginal discharge, urinary symptoms, GI symptoms or abdominal pain.  05/2018 Nexplanon cycles every other month Obesity  Plan: Aware that Nexplanon is good for 3 years.  Denies need for STD screen lives with her boyfriend.  Discussion concerning weight and general health, weight watchers, weight loss surgery, need to increase exercise and decrease calories/carbs discussed.  SBEs, calcium rich foods, MVI daily encouraged.  CBC, glucose, Pap, Pap screening guidelines reviewed.   07/2018 [facility], 2:46 PM 12/20/2019

## 2019-12-21 LAB — URINALYSIS, COMPLETE W/RFL CULTURE
Bacteria, UA: NONE SEEN /HPF
Bilirubin Urine: NEGATIVE
Glucose, UA: NEGATIVE
Hgb urine dipstick: NEGATIVE
Hyaline Cast: NONE SEEN /LPF
Ketones, ur: NEGATIVE
Leukocyte Esterase: NEGATIVE
Nitrites, Initial: NEGATIVE
Protein, ur: NEGATIVE
RBC / HPF: NONE SEEN /HPF (ref 0–2)
Specific Gravity, Urine: 1.015 (ref 1.001–1.03)
WBC, UA: NONE SEEN /HPF (ref 0–5)
pH: 7.5 (ref 5.0–8.0)

## 2019-12-21 LAB — PAP IG W/ RFLX HPV ASCU

## 2019-12-21 LAB — NO CULTURE INDICATED

## 2022-06-30 ENCOUNTER — Telehealth: Payer: 59 | Admitting: Family Medicine

## 2022-06-30 DIAGNOSIS — M546 Pain in thoracic spine: Secondary | ICD-10-CM

## 2022-06-30 MED ORDER — CYCLOBENZAPRINE HCL 10 MG PO TABS: 10.0000 mg | ORAL_TABLET | Freq: Three times a day (TID) | ORAL | 0 refills | Status: DC | PRN

## 2022-06-30 MED ORDER — NAPROXEN 500 MG PO TABS: 500.0000 mg | ORAL_TABLET | Freq: Two times a day (BID) | ORAL | 0 refills | Status: DC

## 2022-06-30 NOTE — Progress Notes (Signed)

## 2022-07-13 ENCOUNTER — Encounter: Payer: Self-pay | Admitting: Nurse Practitioner

## 2022-07-13 ENCOUNTER — Other Ambulatory Visit (HOSPITAL_COMMUNITY)
Admission: RE | Admit: 2022-07-13 | Discharge: 2022-07-13 | Disposition: A | Discharge status: Home / Self Care | Payer: 59 | Source: Ambulatory Visit | Attending: Nurse Practitioner | Admitting: Nurse Practitioner

## 2022-07-13 ENCOUNTER — Ambulatory Visit (INDEPENDENT_AMBULATORY_CARE_PROVIDER_SITE_OTHER): Payer: 59 | Admitting: Nurse Practitioner

## 2022-07-13 VITALS — BP 108/78 | HR 80 | Ht 66.25 in | Wt 345.0 lb

## 2022-07-13 DIAGNOSIS — Z01419 Encounter for gynecological examination (general) (routine) without abnormal findings: Secondary | ICD-10-CM | POA: Diagnosis present

## 2022-07-13 DIAGNOSIS — Z23 Encounter for immunization: Secondary | ICD-10-CM

## 2022-07-13 DIAGNOSIS — Z3046 Encounter for surveillance of implantable subdermal contraceptive: Secondary | ICD-10-CM

## 2022-07-13 NOTE — Progress Notes (Signed)
   Kathy Hensley 1995/10/17 [id number]   History:  26 y.o. G0 presents for annual exam. Nexplanon inserted 1 year ago at [facility]. Menses about every 2 months. Normal pap history. Received 1 dose of Gardasil in 2019.  Gynecologic History Patient's last menstrual period was 07/01/2022 (approximate).   Contraception/Family planning: Nexplanon Sexually active: Yes, declines STD screening  Health Maintenance Last Pap: 12/20/2019. Results were: Normal Last mammogram: Not indicated Last colonoscopy: Not indicated Last Dexa: Not indicated   Past medical history, past surgical history, family history and social history were all reviewed and documented in the EPIC chart. Boyfriend of 5 years. Working at [employer].   ROS:  A ROS was performed and pertinent positives and negatives are included.  Exam:  Vitals:   07/13/22 0847  BP: 108/78  Pulse: 80  SpO2: 98%  Weight: (!) 345 lb (156.5 kg)  Height: 5' 6.25" (1.683 m)   Body mass index is 55.26 kg/m.  General appearance:  Normal Thyroid:  Symmetrical, normal in size, without palpable masses or nodularity. Respiratory  Auscultation:  Clear without wheezing or rhonchi Cardiovascular  Auscultation:  Regular rate, without rubs, murmurs or gallops  Edema/varicosities:  Not grossly evident Abdominal  Soft,nontender, without masses, guarding or rebound.  Liver/spleen:  No organomegaly noted  Hernia:  None appreciated  Skin  Inspection:  Grossly normal Breasts: Examined lying and sitting.   Right: Without masses, retractions, nipple discharge or axillary adenopathy.   Left: Without masses, retractions, nipple discharge or axillary adenopathy. Genitourinary   Inguinal/mons:  Normal without inguinal adenopathy  External genitalia:  Normal appearing vulva with no masses, tenderness, or lesions  BUS/Urethra/Skene's glands:  Normal  Vagina:  Normal appearing with normal color and discharge, no lesions  Cervix:  Normal  appearing without discharge or lesions  Uterus:  Difficult to palpate due to body habitus but no gross masses or tenderness  Adnexa/parametria:     Rt: Normal in size, without masses or tenderness.   Lt: Normal in size, without masses or tenderness.  Anus and perineum: Normal  Digital rectal exam: Not indicated  Patient informed chaperone available to be present for breast and pelvic exam. Patient has requested no chaperone to be present. Patient has been advised what will be completed during breast and pelvic exam.   Assessment/Plan:  26 y.o. G0 for annual exam.   Well female exam with routine gynecological exam - Plan: Cytology - PAP( [place]). Education provided on SBEs, importance of preventative screenings, current guidelines, high calcium diet, regular exercise, and multivitamin daily.  Establishing with PCP this month, will have labs done there.   Encounter for surveillance of implantable subdermal contraceptive - Nexplanon inserted 1 year ago at [facility]. Menses about every 2 months.   Need for HPV vaccination - Plan: HPV 9-valent vaccine,Recombinat. 2nd dose today. Will return in 4 months for final dose.   Screening for cervical cancer - Normal Pap history.  Pap today.    Return in 1 year for annual.     Olivia Mackie DNP, 9:18 AM 07/13/2022

## 2022-07-14 LAB — CYTOLOGY - PAP: Diagnosis: NEGATIVE

## 2022-08-10 ENCOUNTER — Ambulatory Visit: Payer: Commercial Managed Care - PPO | Admitting: Physician Assistant

## 2022-11-02 ENCOUNTER — Ambulatory Visit: Payer: 59

## 2023-01-21 ENCOUNTER — Telehealth: Payer: BC Managed Care – PPO | Admitting: Physician Assistant

## 2023-01-21 DIAGNOSIS — A084 Viral intestinal infection, unspecified: Secondary | ICD-10-CM

## 2023-01-21 MED ORDER — ONDANSETRON 4 MG PO TBDP: 4.0000 mg | ORAL_TABLET | Freq: Three times a day (TID) | ORAL | 0 refills | Status: AC | PRN

## 2023-01-21 NOTE — Progress Notes (Signed)
I have spent 5 minutes in review of e-visit questionnaire, review and updating patient chart, medical decision making and response to patient.   Itzae Mccurdy Cody Whitlee Sluder, PA-C    

## 2023-01-21 NOTE — Progress Notes (Signed)
Message sent to patient requesting further input regarding current symptoms. Awaiting patient response.  

## 2023-01-21 NOTE — Progress Notes (Signed)
E-Visit for Vomiting  We are sorry that you are not feeling well. Here is how we plan to help!  Based on what you have shared with me it looks like you have a Virus that is irritating your GI tract.  Vomiting is the forceful emptying of a portion of the stomach's content through the mouth.  Although nausea and vomiting can make you feel miserable, it's important to remember that these are not diseases, but rather symptoms of an underlying illness.  When we treat short term symptoms, we always caution that any symptoms that persist should be fully evaluated in a medical office.  I have prescribed a medication that will help alleviate your symptoms and allow you to stay hydrated:  Zofran 4 mg 1 tablet every 8 hours as needed for nausea and vomiting. I also recommend starting OTC Famotidine (Pepcid) 20 mg twice daily for the next couple of days to reduce acid production in the stomach.  HOME CARE: Drink clear liquids.  This is very important! Dehydration (the lack of fluid) can lead to a serious complication.  Start off with 1 tablespoon every 5 minutes for 8 hours. You may begin eating bland foods after 8 hours without vomiting.  Start with saltine crackers, white bread, rice, mashed potatoes, applesauce. After 48 hours on a bland diet, you may resume a normal diet. Try to go to sleep.  Sleep often empties the stomach and relieves the need to vomit.  GET HELP RIGHT AWAY IF:  Your symptoms do not improve or worsen within 2 days after treatment. You have a fever for over 3 days. You cannot keep down fluids after trying the medication.  MAKE SURE YOU:  Understand these instructions. Will watch your condition. Will get help right away if you are not doing well or get worse.   Thank you for choosing an e-visit.  Your e-visit answers were reviewed by a board certified advanced clinical practitioner to complete your personal care plan. Depending upon the condition, your plan could have  included both over the counter or prescription medications.  Please review your pharmacy choice. Make sure the pharmacy is open so you can pick up prescription now. If there is a problem, you may contact your provider through [employer] and have the prescription routed to another pharmacy.  Your safety is important to [place]. If you have drug allergies check your prescription carefully.   For the next 24 hours you can use MyChart to ask questions about today's visit, request a non-urgent call back, or ask for a work or school excuse. You will get an email in the next two days asking about your experience. I hope that your e-visit has been valuable and will speed your recovery.

## 2023-03-12 DIAGNOSIS — K529 Noninfective gastroenteritis and colitis, unspecified: Secondary | ICD-10-CM | POA: Diagnosis not present

## 2023-03-18 DIAGNOSIS — R634 Abnormal weight loss: Secondary | ICD-10-CM | POA: Diagnosis not present

## 2023-03-18 DIAGNOSIS — K529 Noninfective gastroenteritis and colitis, unspecified: Secondary | ICD-10-CM | POA: Diagnosis not present

## 2023-03-18 DIAGNOSIS — R1013 Epigastric pain: Secondary | ICD-10-CM | POA: Diagnosis not present

## 2023-03-18 DIAGNOSIS — K219 Gastro-esophageal reflux disease without esophagitis: Secondary | ICD-10-CM | POA: Diagnosis not present

## 2023-03-18 DIAGNOSIS — R194 Change in bowel habit: Secondary | ICD-10-CM | POA: Diagnosis not present

## 2023-03-18 DIAGNOSIS — R112 Nausea with vomiting, unspecified: Secondary | ICD-10-CM | POA: Diagnosis not present

## 2023-03-22 DIAGNOSIS — R194 Change in bowel habit: Secondary | ICD-10-CM | POA: Diagnosis not present

## 2023-03-22 DIAGNOSIS — K529 Noninfective gastroenteritis and colitis, unspecified: Secondary | ICD-10-CM | POA: Diagnosis not present

## 2023-03-23 ENCOUNTER — Encounter: Payer: Self-pay | Admitting: Internal Medicine

## 2023-03-23 ENCOUNTER — Ambulatory Visit (INDEPENDENT_AMBULATORY_CARE_PROVIDER_SITE_OTHER): Payer: BC Managed Care – PPO | Admitting: Internal Medicine

## 2023-03-23 VITALS — BP 122/78 | HR 88 | Temp 97.3°F | Ht 66.0 in | Wt 331.4 lb

## 2023-03-23 DIAGNOSIS — F331 Major depressive disorder, recurrent, moderate: Secondary | ICD-10-CM

## 2023-03-23 DIAGNOSIS — F339 Major depressive disorder, recurrent, unspecified: Secondary | ICD-10-CM | POA: Insufficient documentation

## 2023-03-23 DIAGNOSIS — F4010 Social phobia, unspecified: Secondary | ICD-10-CM | POA: Insufficient documentation

## 2023-03-23 DIAGNOSIS — F458 Other somatoform disorders: Secondary | ICD-10-CM | POA: Diagnosis not present

## 2023-03-23 MED ORDER — VENLAFAXINE HCL 50 MG PO TABS: ORAL_TABLET | ORAL | 0 refills | Status: DC

## 2023-03-23 MED ORDER — FLUOXETINE HCL 10 MG PO CAPS: 10.0000 mg | ORAL_CAPSULE | Freq: Every day | ORAL | 1 refills | Status: DC

## 2023-03-23 NOTE — Patient Instructions (Signed)
Continue Wellbutrin as prescribed   TAPER VENLAFAXINE [facility]):  Take 1 tablet 2 times a day for 7 days  Then take 1 tablet once daily for 7 days.  Then STOP.   CAN start taking Prozac when taking the effexor one time a day.

## 2023-03-23 NOTE — Progress Notes (Signed)
[facility] [address] [place] [address] Dept: [phone] Dept Fax: [phone]  New Patient Office Visit  Subjective:   Kathy Hensley [birth year] 03/23/2023  Chief Complaint  Patient presents with   Establish Care    Discuss anxiety Pt was able to GI issues handled with a referral     HPI: Kathy Hensley presents today to establish care at [facility] [employer]. Introduced to Publishing rights manager role and practice setting.  All questions answered.  Concerns: See below   Discussed the use of AI scribe software for clinical note transcription with the patient, who gave verbal consent to proceed.  History of Present Illness   The patient, with a history of anxiety, depression, and a Nexplanon implant for birth control, presents with worsening anxiety and gastrointestinal issues. The patient reports that the anxiety is particularly severe in social situations and in the mornings, with symptoms including heart racing and excessive sweating. The anxiety is reportedly worse in the week leading up to the patient's period. The patient is currently on Wellbutrin for depression, which they report has been effective, and Effexor for anxiety, which they report has not been helpful. The patient expresses a willingness to try a different medication for anxiety, despite previous negative sexual side effects with similar medications.  The patient also reports gastrointestinal issues, including morning indigestion, pain in the middle of the abdomen, vomiting, and diarrhea. These symptoms are reportedly worse on an empty stomach. The patient has been taking over-the-counter omeprazole, which has provided some relief. She is currently managed by GI for symptoms. Due to a family history of colon cancer (maternal grandfather), the patient is scheduled for a colonoscopy and endoscopy with GI.         03/23/2023   10:10 AM  GAD  7 : Generalized Anxiety Score  Nervous, Anxious, on Edge 3  Control/stop worrying 3  Worry too much - different things 2  Trouble relaxing 2  Restless 2  Easily annoyed or irritable 2  Afraid - awful might happen 1  Total GAD 7 Score 15  Anxiety Difficulty Very difficult       03/23/2023   10:09 AM 08/07/2015    2:42 PM 07/07/2015    9:53 AM  Depression screen PHQ 2/9  Decreased Interest 1 0 0  Down, Depressed, Hopeless 2 0 0  PHQ - 2 Score 3 0 0  Altered sleeping 0    Tired, decreased energy 1    Change in appetite 2    Feeling bad or failure about yourself  1    Trouble concentrating 1    Moving slowly or fidgety/restless 0    Suicidal thoughts 0    PHQ-9 Score 8    Difficult doing work/chores Somewhat difficult       The following portions of the patient's history were reviewed and updated as appropriate: past medical history, past surgical history, family history, social history, allergies, medications, and problem list.   Patient Active Problem List   Diagnosis Date Noted   Social anxiety disorder 03/23/2023   Major depression, recurrent (HCC) 03/23/2023   Nexplanon in place 12/20/2019   Morbid obesity (HCC) 10/04/2017   Past Medical History:  Diagnosis Date   Anxiety    Past Surgical History:  Procedure Laterality Date   FRACTURE SURGERY Right 2008   foot   Nexplanon  06/01/2018   TONSILLECTOMY     WISDOM TOOTH EXTRACTION     Family History  Problem Relation Age of Onset   Hypertension Mother    Diabetes Father    Hypertension Father    Heart disease Father    Hyperlipidemia Father    Breast cancer Paternal Aunt    Diabetes Maternal Grandmother    Cancer Maternal Grandfather        Colon Cancer   Hyperlipidemia Paternal Grandmother    Cancer Paternal Grandfather    Outpatient Medications Prior to Visit  Medication Sig Dispense Refill   buPROPion (WELLBUTRIN XL) 300 MG 24 hr tablet Take 300 mg by mouth daily.     ibuprofen (ADVIL,MOTRIN) 100 MG  tablet Take 100 mg by mouth every 6 (six) hours as needed for fever.     ondansetron (ZOFRAN-ODT) 4 MG disintegrating tablet Take 1 tablet (4 mg total) by mouth every 8 (eight) hours as needed for nausea or vomiting. 20 tablet 0   venlafaxine XR (EFFEXOR-XR) 150 MG 24 hr capsule      Omeprazole 20 MG TBDD Take 40 mg by mouth daily.     No facility-administered medications prior to visit.   No Known Allergies  ROS: A complete ROS was performed with pertinent positives/negatives noted in the HPI. The remainder of the ROS are negative.   Objective:   Today's Vitals   03/23/23 0939  BP: 122/78  Pulse: 88  Temp: (!) 97.3 F (36.3 C)  TempSrc: Temporal  SpO2: 98%  Weight: (!) 331 lb 6.4 oz (150.3 kg)  Height: 5\' 6"  (1.676 m)    GENERAL: Well-appearing, in NAD. Well nourished.  SKIN: Pink, warm and dry. No rash, lesion, ulceration, or ecchymoses.  NECK: Trachea midline. Full ROM w/o pain or tenderness. No lymphadenopathy.  RESPIRATORY: Chest wall symmetrical. Respirations even and non-labored. Breath sounds clear to auscultation bilaterally.  CARDIAC: S1, S2 present, regular rate and rhythm. Peripheral pulses 2+ bilaterally.  EXTREMITIES: Without clubbing, cyanosis, or edema.  NEUROLOGIC: Steady, even gait.  PSYCH/MENTAL STATUS: Alert, oriented x 3. Cooperative, appropriate mood and affect.   Health Maintenance Due  Topic Date Due   HIV Screening  Never done   Hepatitis C Screening  Never done   DTaP/Tdap/Td (1 - Tdap) Never done   COVID-19 Vaccine (1 - 2023-24 season) Never done   HPV VACCINES (3 - 3-dose series) 10/05/2022    No results found for any visits on 03/23/23.  Assessment & Plan:  Assessment and Plan    Anxiety and Depression: Increased anxiety symptoms, particularly social anxiety, worsening in the week before menstruation. Currently on Wellbutrin 300mg  daily and Effexor 150mg  daily. Wellbutrin effective for depression, but Effexor not perceived as beneficial for  anxiety. Previous trial of Lexapro effective for anxiety but discontinued due to sexual side effects. Patient open to retrying an SSRI. -Taper off Effexor, starting with a reduction to 50mg  BID, then 50mg  PO daily, then STOP -Initiate Fluoxetine 10mg  daily. Instructed patient to start Prozac when taking effexor 50mg  PO once daily, not while taking it BID.  -Schedule follow-up in 6 weeks to assess response to medication changes.  Gastrointestinal Issues: Morning indigestion and pain, occasional vomiting, and diarrhea. Worsens with anxiety. Currently taking Omeprazole 40mg  daily. Family history of early-onset colon cancer. Scheduled for colonoscopy and endoscopy with gastroenterologist in early August. -Continue Omeprazole 40mg  daily. -Continue with scheduled gastroenterology workup.      Meds ordered this encounter  Medications   venlafaxine (EFFEXOR) 50 MG tablet    Sig: Take 1 tablet by mouth 2 times a day for 7 days.  Then take 1 tablet by mouth once daily for 7 days. Then STOP.    Dispense:  21 tablet    Refill:  0    Order Specific Question:   Supervising Provider    Answer:   Garnette Gunner [[id number]]   FLUoxetine (PROZAC) 10 MG capsule    Sig: Take 1 capsule (10 mg total) by mouth daily.    Dispense:  90 capsule    Refill:  1    Order Specific Question:   Supervising Provider    Answer:   Garnette Gunner [[id number]]     Return in about 6 weeks (around 05/04/2023) for Anxiety/Depression.   Of note, portions of this note may have been created with voice recognition software Physicist, medical). While this note has been edited for accuracy, occasional wrong-word or 'sound-a-like' substitutions may have occurred due to the inherent limitations of voice recognition software.  Salvatore Decent, FNP

## 2023-04-01 ENCOUNTER — Ambulatory Visit: Payer: BC Managed Care – PPO | Admitting: Internal Medicine

## 2023-04-09 DIAGNOSIS — K591 Functional diarrhea: Secondary | ICD-10-CM | POA: Diagnosis not present

## 2023-04-09 DIAGNOSIS — R194 Change in bowel habit: Secondary | ICD-10-CM | POA: Diagnosis not present

## 2023-04-09 DIAGNOSIS — R112 Nausea with vomiting, unspecified: Secondary | ICD-10-CM | POA: Diagnosis not present

## 2023-04-09 DIAGNOSIS — R634 Abnormal weight loss: Secondary | ICD-10-CM | POA: Diagnosis not present

## 2023-04-09 DIAGNOSIS — R197 Diarrhea, unspecified: Secondary | ICD-10-CM | POA: Diagnosis not present

## 2023-04-09 DIAGNOSIS — K3189 Other diseases of stomach and duodenum: Secondary | ICD-10-CM | POA: Diagnosis not present

## 2023-04-09 DIAGNOSIS — K635 Polyp of colon: Secondary | ICD-10-CM | POA: Diagnosis not present

## 2023-04-14 DIAGNOSIS — R197 Diarrhea, unspecified: Secondary | ICD-10-CM | POA: Diagnosis not present

## 2023-04-14 DIAGNOSIS — R634 Abnormal weight loss: Secondary | ICD-10-CM | POA: Diagnosis not present

## 2023-04-15 ENCOUNTER — Encounter (INDEPENDENT_AMBULATORY_CARE_PROVIDER_SITE_OTHER): Payer: Self-pay

## 2023-04-26 DIAGNOSIS — F4010 Social phobia, unspecified: Secondary | ICD-10-CM | POA: Diagnosis not present

## 2023-04-26 DIAGNOSIS — F330 Major depressive disorder, recurrent, mild: Secondary | ICD-10-CM | POA: Diagnosis not present

## 2023-05-04 ENCOUNTER — Telehealth: Payer: Self-pay | Admitting: Internal Medicine

## 2023-05-04 ENCOUNTER — Ambulatory Visit: Payer: BC Managed Care – PPO | Admitting: Internal Medicine

## 2023-05-04 NOTE — Telephone Encounter (Signed)
9.3.24 no show letter sent

## 2023-05-06 DIAGNOSIS — F330 Major depressive disorder, recurrent, mild: Secondary | ICD-10-CM | POA: Diagnosis not present

## 2023-05-06 DIAGNOSIS — F4010 Social phobia, unspecified: Secondary | ICD-10-CM | POA: Diagnosis not present

## 2023-05-06 NOTE — Telephone Encounter (Signed)
 1st no show, letter sent via mail

## 2023-05-13 DIAGNOSIS — F330 Major depressive disorder, recurrent, mild: Secondary | ICD-10-CM | POA: Diagnosis not present

## 2023-05-13 DIAGNOSIS — F4010 Social phobia, unspecified: Secondary | ICD-10-CM | POA: Diagnosis not present

## 2023-05-25 DIAGNOSIS — R112 Nausea with vomiting, unspecified: Secondary | ICD-10-CM | POA: Diagnosis not present

## 2023-05-25 DIAGNOSIS — K219 Gastro-esophageal reflux disease without esophagitis: Secondary | ICD-10-CM | POA: Diagnosis not present

## 2023-05-25 DIAGNOSIS — R1013 Epigastric pain: Secondary | ICD-10-CM | POA: Diagnosis not present

## 2023-05-27 DIAGNOSIS — F330 Major depressive disorder, recurrent, mild: Secondary | ICD-10-CM | POA: Diagnosis not present

## 2023-05-27 DIAGNOSIS — F4010 Social phobia, unspecified: Secondary | ICD-10-CM | POA: Diagnosis not present

## 2023-06-03 DIAGNOSIS — F4010 Social phobia, unspecified: Secondary | ICD-10-CM | POA: Diagnosis not present

## 2023-06-03 DIAGNOSIS — F330 Major depressive disorder, recurrent, mild: Secondary | ICD-10-CM | POA: Diagnosis not present

## 2023-06-17 DIAGNOSIS — F4010 Social phobia, unspecified: Secondary | ICD-10-CM | POA: Diagnosis not present

## 2023-06-17 DIAGNOSIS — F330 Major depressive disorder, recurrent, mild: Secondary | ICD-10-CM | POA: Diagnosis not present

## 2023-06-24 DIAGNOSIS — F4010 Social phobia, unspecified: Secondary | ICD-10-CM | POA: Diagnosis not present

## 2023-06-24 DIAGNOSIS — F330 Major depressive disorder, recurrent, mild: Secondary | ICD-10-CM | POA: Diagnosis not present

## 2023-07-01 ENCOUNTER — Ambulatory Visit: Payer: BC Managed Care – PPO | Admitting: Internal Medicine

## 2023-07-01 VITALS — BP 128/84 | HR 77 | Temp 98.2°F | Ht 66.0 in | Wt 319.6 lb

## 2023-07-01 DIAGNOSIS — Z23 Encounter for immunization: Secondary | ICD-10-CM | POA: Diagnosis not present

## 2023-07-01 DIAGNOSIS — F331 Major depressive disorder, recurrent, moderate: Secondary | ICD-10-CM

## 2023-07-01 DIAGNOSIS — F330 Major depressive disorder, recurrent, mild: Secondary | ICD-10-CM | POA: Diagnosis not present

## 2023-07-01 DIAGNOSIS — F4010 Social phobia, unspecified: Secondary | ICD-10-CM | POA: Diagnosis not present

## 2023-07-01 MED ORDER — FLUOXETINE HCL 10 MG PO CAPS: 30.0000 mg | ORAL_CAPSULE | Freq: Every day | ORAL | 1 refills | Status: DC

## 2023-07-01 MED ORDER — BUPROPION HCL ER (XL) 300 MG PO TB24: 300.0000 mg | ORAL_TABLET | Freq: Every day | ORAL | 3 refills | Status: DC

## 2023-07-01 NOTE — Progress Notes (Signed)
[facility] [address] [place] [address] Dept: [phone] Dept Fax: [phone]    Subjective:   Kathy Hensley 07/28/1996 07/01/2023  Chief Complaint  Patient presents with   Follow-up    HPI: Kathy Hensley presents today for re-assessment and management of chronic medical conditions.  Discussed the use of AI scribe software for clinical note transcription with the patient, who gave verbal consent to proceed.  History of Present Illness   The patient, with a history of social anxiety and depression, reports improvement since starting Prozac 20mg  daily and therapy. She was previously on Wellbutrin 300mg  daily and Effexor. Effexor   was tapered off due to lack of efficacy. She reports feeling 'more at ease' with Prozac and Wellbutrin and has not experienced any sexual side effects. She is also doing counseling which she is finding very beneficial. She would like flu shot today, recently recovered from COVID19 1 month ago.         07/01/2023    3:15 PM 03/23/2023   10:09 AM 08/07/2015    2:42 PM  PHQ9 SCORE ONLY  PHQ-9 Total Score 2 8 0      The following portions of the patient's history were reviewed and updated as appropriate: past medical history, past surgical history, family history, social history, allergies, medications, and problem list.   Patient Active Problem List   Diagnosis Date Noted   Social anxiety disorder 03/23/2023   Major depression, recurrent (HCC) 03/23/2023   Nervous indigestion 03/23/2023   Nexplanon in place 12/20/2019   Morbid obesity (HCC) 10/04/2017   Past Medical History:  Diagnosis Date   Anxiety    Past Surgical History:  Procedure Laterality Date   FRACTURE SURGERY Right 2008   foot   Nexplanon  06/01/2018   TONSILLECTOMY     WISDOM TOOTH EXTRACTION     Family History  Problem Relation Age of Onset   Hypertension Mother    Diabetes Father    Hypertension Father     Heart disease Father    Hyperlipidemia Father    Breast cancer Paternal Aunt    Diabetes Maternal Grandmother    Cancer Maternal Grandfather        Colon Cancer   Hyperlipidemia Paternal Grandmother    Cancer Paternal Grandfather     Current Outpatient Medications:    FLUoxetine (PROZAC) 10 MG capsule, Take 3 capsules (30 mg total) by mouth daily., Disp: 270 capsule, Rfl: 1   ibuprofen (ADVIL,MOTRIN) 100 MG tablet, Take 100 mg by mouth every 6 (six) hours as needed for fever., Disp: , Rfl:    Omeprazole 20 MG TBDD, Take 40 mg by mouth daily., Disp: , Rfl:    ondansetron (ZOFRAN-ODT) 4 MG disintegrating tablet, Take 1 tablet (4 mg total) by mouth every 8 (eight) hours as needed for nausea or vomiting., Disp: 20 tablet, Rfl: 0   buPROPion (WELLBUTRIN XL) 300 MG 24 hr tablet, Take 1 tablet (300 mg total) by mouth daily., Disp: 90 tablet, Rfl: 3 No Known Allergies   ROS: A complete ROS was performed with pertinent positives/negatives noted in the HPI. The remainder of the ROS are negative.    Objective:   Today's Vitals   07/01/23 1516  BP: 128/84  Pulse: 77  Temp: 98.2 F (36.8 C)  TempSrc: Temporal  SpO2: 98%  Weight: (!) 319 lb 9.6 oz (145 kg)  Height: 5\' 6"  (1.676 m)    GENERAL: Well-appearing, in NAD. Well nourished.  SKIN: Pink, warm and dry.  RESPIRATORY: Chest wall symmetrical. Respirations even and non-labored.  PSYCH/MENTAL STATUS: Alert, oriented x 3. Cooperative, appropriate mood and affect.   Health Maintenance Due  Topic Date Due   HIV Screening  Never done   Hepatitis C Screening  Never done   DTaP/Tdap/Td (1 - Tdap) Never done   HPV VACCINES (3 - 3-dose series) 10/05/2022   INFLUENZA VACCINE  04/01/2023    No results found for any visits on 07/01/23.  The ASCVD Risk score (Arnett DK, et al., 2019) failed to calculate for the following reasons:   The 2019 ASCVD risk score is only valid for ages 38 to 60     Assessment & Plan:  Assessment and  Plan    Anxiety Improvement noted with combination of Prozac 20mg  daily and counseling. No adverse effects reported. Patient desires further improvement. -Increase Prozac to 30mg  daily. -Check in 2-3 weeks for possible increase to 40mg  daily if needed. -Continue counseling.   Depression Continue Wellbutrin 300mg  XL daily  General Health Maintenance -Administer influenza vaccine today. -Schedule annual physical in 6 months, including fasting blood work, Pap smear, and tetanus shot update. -Consider COVID-19 booster shot in 2 months (3 months post-infection).      Orders Placed This Encounter  Procedures   Flu vaccine trivalent PF, 6mos and older(Flulaval,Afluria,Fluarix,Fluzone)   No images are attached to the encounter or orders placed in the encounter. Meds ordered this encounter  Medications   FLUoxetine (PROZAC) 10 MG capsule    Sig: Take 3 capsules (30 mg total) by mouth daily.    Dispense:  270 capsule    Refill:  1    Order Specific Question:   Supervising Provider    Answer:   Garnette Gunner [[id number]]   buPROPion (WELLBUTRIN XL) 300 MG 24 hr tablet    Sig: Take 1 tablet (300 mg total) by mouth daily.    Dispense:  90 tablet    Refill:  3    Order Specific Question:   Supervising Provider    Answer:   Garnette Gunner [[id number]]    Return in about 6 months (around 12/29/2023) for Fasting Annual Physical Exam.   Salvatore Decent, FNP

## 2023-07-05 ENCOUNTER — Telehealth: Payer: Self-pay

## 2023-07-05 DIAGNOSIS — F4010 Social phobia, unspecified: Secondary | ICD-10-CM

## 2023-07-05 NOTE — Telephone Encounter (Signed)
Patient insurance only covers 1 capsule per day- PA will need to be done for this dose  FLUoxetine (PROZAC) 10 MG capsule   Please advise

## 2023-07-06 ENCOUNTER — Other Ambulatory Visit (HOSPITAL_COMMUNITY): Payer: Self-pay

## 2023-07-06 ENCOUNTER — Encounter: Payer: Self-pay | Admitting: Internal Medicine

## 2023-07-06 ENCOUNTER — Telehealth: Payer: Self-pay

## 2023-07-06 MED ORDER — FLUOXETINE HCL 10 MG PO CAPS: 30.0000 mg | ORAL_CAPSULE | Freq: Every day | ORAL | 2 refills | Status: DC

## 2023-07-06 NOTE — Telephone Encounter (Signed)
Pharmacy Patient Advocate Encounter   Received notification from Physician's Office that prior authorization for FLUOXETINE is required/requested.   Insurance verification completed.   The patient is insured through [facility] .   Per test claim: PA required; PA submitted to above mentioned insurance via CoverMyMeds Key/confirmation #/EOC Key: [id number] Status is pending

## 2023-07-06 NOTE — Telephone Encounter (Signed)
Pharmacy requesting PA for Rx FLUoxetine (PROZAC) 10 MG capsule

## 2023-07-06 NOTE — Addendum Note (Signed)
Addended by: Salvatore Decent on: 07/06/2023 09:24 AM   Modules accepted: Orders

## 2023-07-08 DIAGNOSIS — F330 Major depressive disorder, recurrent, mild: Secondary | ICD-10-CM | POA: Diagnosis not present

## 2023-07-08 DIAGNOSIS — F4010 Social phobia, unspecified: Secondary | ICD-10-CM | POA: Diagnosis not present

## 2023-07-09 NOTE — Telephone Encounter (Signed)
Noted  

## 2023-07-09 NOTE — Telephone Encounter (Signed)
Please update on PA

## 2023-07-12 ENCOUNTER — Other Ambulatory Visit (HOSPITAL_COMMUNITY): Payer: Self-pay

## 2023-07-12 NOTE — Telephone Encounter (Signed)
Pharmacy Patient Advocate Encounter  Received notification from [facility] that Prior Authorization for Fluoxetine 10mg  caps has been APPROVED from 07/08/23 to 07/07/24   PA #/Case ID/Reference #:  [id number]

## 2023-07-12 NOTE — Telephone Encounter (Signed)
Noted  

## 2023-07-15 DIAGNOSIS — F330 Major depressive disorder, recurrent, mild: Secondary | ICD-10-CM | POA: Diagnosis not present

## 2023-07-15 DIAGNOSIS — F4010 Social phobia, unspecified: Secondary | ICD-10-CM | POA: Diagnosis not present

## 2023-07-22 DIAGNOSIS — F330 Major depressive disorder, recurrent, mild: Secondary | ICD-10-CM | POA: Diagnosis not present

## 2023-07-22 DIAGNOSIS — F4010 Social phobia, unspecified: Secondary | ICD-10-CM | POA: Diagnosis not present

## 2023-08-05 ENCOUNTER — Emergency Department (HOSPITAL_COMMUNITY)
Admission: EM | Admit: 2023-08-05 | Discharge: 2023-08-05 | Disposition: A | Discharge status: Home / Self Care | Payer: BC Managed Care – PPO | Attending: Student | Admitting: Student

## 2023-08-05 ENCOUNTER — Encounter (HOSPITAL_COMMUNITY): Payer: Self-pay

## 2023-08-05 ENCOUNTER — Other Ambulatory Visit: Payer: Self-pay

## 2023-08-05 ENCOUNTER — Emergency Department (HOSPITAL_COMMUNITY): Payer: BC Managed Care – PPO

## 2023-08-05 DIAGNOSIS — K760 Fatty (change of) liver, not elsewhere classified: Secondary | ICD-10-CM | POA: Diagnosis not present

## 2023-08-05 DIAGNOSIS — K429 Umbilical hernia without obstruction or gangrene: Secondary | ICD-10-CM | POA: Diagnosis not present

## 2023-08-05 DIAGNOSIS — R1011 Right upper quadrant pain: Secondary | ICD-10-CM | POA: Diagnosis not present

## 2023-08-05 DIAGNOSIS — R1013 Epigastric pain: Secondary | ICD-10-CM | POA: Insufficient documentation

## 2023-08-05 DIAGNOSIS — D72829 Elevated white blood cell count, unspecified: Secondary | ICD-10-CM | POA: Diagnosis not present

## 2023-08-05 DIAGNOSIS — K226 Gastro-esophageal laceration-hemorrhage syndrome: Secondary | ICD-10-CM

## 2023-08-05 DIAGNOSIS — K920 Hematemesis: Secondary | ICD-10-CM | POA: Diagnosis not present

## 2023-08-05 DIAGNOSIS — N281 Cyst of kidney, acquired: Secondary | ICD-10-CM | POA: Diagnosis not present

## 2023-08-05 DIAGNOSIS — R109 Unspecified abdominal pain: Secondary | ICD-10-CM

## 2023-08-05 LAB — URINALYSIS, ROUTINE W REFLEX MICROSCOPIC
Bilirubin Urine: NEGATIVE
Glucose, UA: NEGATIVE mg/dL
Hgb urine dipstick: NEGATIVE
Ketones, ur: NEGATIVE mg/dL
Leukocytes,Ua: NEGATIVE
Nitrite: NEGATIVE
Protein, ur: 30 mg/dL — AB
Specific Gravity, Urine: 1.024 (ref 1.005–1.030)
pH: 5 (ref 5.0–8.0)

## 2023-08-05 LAB — CBC
HCT: 41.8 % (ref 36.0–46.0)
Hemoglobin: 13.8 g/dL (ref 12.0–15.0)
MCH: 28.3 pg (ref 26.0–34.0)
MCHC: 33 g/dL (ref 30.0–36.0)
MCV: 85.8 fL (ref 80.0–100.0)
Platelets: 418 10*3/uL — ABNORMAL HIGH (ref 150–400)
RBC: 4.87 MIL/uL (ref 3.87–5.11)
RDW: 13.2 % (ref 11.5–15.5)
WBC: 14.7 10*3/uL — ABNORMAL HIGH (ref 4.0–10.5)
nRBC: 0 % (ref 0.0–0.2)

## 2023-08-05 LAB — URINALYSIS, MICROSCOPIC (REFLEX)

## 2023-08-05 LAB — COMPREHENSIVE METABOLIC PANEL
ALT: 52 U/L — ABNORMAL HIGH (ref 0–44)
AST: 32 U/L (ref 15–41)
Albumin: 4.4 g/dL (ref 3.5–5.0)
Alkaline Phosphatase: 63 U/L (ref 38–126)
Anion gap: 12 (ref 5–15)
BUN: 11 mg/dL (ref 6–20)
CO2: 23 mmol/L (ref 22–32)
Calcium: 9.6 mg/dL (ref 8.9–10.3)
Chloride: 106 mmol/L (ref 98–111)
Creatinine, Ser: 0.78 mg/dL (ref 0.44–1.00)
GFR, Estimated: >60 mL/min (ref >60)
Glucose, Bld: 122 mg/dL — ABNORMAL HIGH (ref 70–99)
Potassium: 3.7 mmol/L (ref 3.5–5.1)
Sodium: 141 mmol/L (ref 135–145)
Total Bilirubin: 0.8 mg/dL (ref <1.2)
Total Protein: 8.3 g/dL — ABNORMAL HIGH (ref 6.5–8.1)

## 2023-08-05 LAB — LIPASE, BLOOD: Lipase: 34 U/L (ref 11–51)

## 2023-08-05 LAB — HCG, SERUM, QUALITATIVE: Preg, Serum: NEGATIVE

## 2023-08-05 MED ORDER — DICYCLOMINE HCL 20 MG PO TABS: 20.0000 mg | ORAL_TABLET | Freq: Two times a day (BID) | ORAL | 0 refills | Status: AC | PRN

## 2023-08-05 MED ORDER — IOHEXOL 300 MG/ML  SOLN
100.0000 mL | Freq: Once | INTRAMUSCULAR | Status: AC | PRN
  Administered 2023-08-05: 100 mL via INTRAVENOUS

## 2023-08-05 NOTE — ED Triage Notes (Signed)
Patient is here for evaluation of upper abdominal pain. Reports that she has been vomiting that past few morning and noticed some blood in it. Reports that she has been dealing with some stomach issues.

## 2023-08-05 NOTE — ED Provider Notes (Signed)
[place] EMERGENCY DEPARTMENT AT [facility] Provider Note  CSN: [id number] Arrival date & time: 08/05/23 5784  Chief Complaint(s) Abdominal Pain  HPI Kathy Hensley is a 27 y.o. female with PMH anxiety, major depression, obesity who presents emergency room for evaluation of abdominal pain vomiting and hematemesis.  Patient states that for multiple months she will have episodes of vomiting in the morning with intermittent epigastric and right upper quadrant pain.  Had an episode of vomiting with streaks of blood in it this morning but not large-volume hematemesis.  Has seen outpatient gastroenterology and had a colonoscopy and EGD both of which were reassuringly normal.  Denies associated chest pain, shortness of breath, fever, or other systemic symptoms.  No hematochezia.   Past Medical History Past Medical History:  Diagnosis Date   Anxiety    Patient Active Problem List   Diagnosis Date Noted   Social anxiety disorder 03/23/2023   Major depression, recurrent (HCC) 03/23/2023   Nervous indigestion 03/23/2023   Nexplanon in place 12/20/2019   Morbid obesity (HCC) 10/04/2017   Home Medication(s) Prior to Admission medications   Medication Sig Start Date End Date Taking? Authorizing Provider  buPROPion (WELLBUTRIN XL) 300 MG 24 hr tablet Take 1 tablet (300 mg total) by mouth daily. 07/01/23   Salvatore Decent, FNP  FLUoxetine (PROZAC) 10 MG capsule Take 3 capsules (30 mg total) by mouth daily. 07/06/23 10/04/23  Salvatore Decent, FNP  ibuprofen (ADVIL,MOTRIN) 100 MG tablet Take 100 mg by mouth every 6 (six) hours as needed for fever.    [provider]  Omeprazole 20 MG TBDD Take 40 mg by mouth daily.    [provider]  ondansetron (ZOFRAN-ODT) 4 MG disintegrating tablet Take 1 tablet (4 mg total) by mouth every 8 (eight) hours as needed for nausea or vomiting. 01/21/23   Waldon Merl, PA-C                                                                                                                                     Past Surgical History Past Surgical History:  Procedure Laterality Date   FRACTURE SURGERY Right 2008   foot   Nexplanon  06/01/2018   TONSILLECTOMY     WISDOM TOOTH EXTRACTION     Family History Family History  Problem Relation Age of Onset   Hypertension Mother    Diabetes Father    Hypertension Father    Heart disease Father    Hyperlipidemia Father    Breast cancer Paternal Aunt    Diabetes Maternal Grandmother    Cancer Maternal Grandfather        Colon Cancer   Hyperlipidemia Paternal Grandmother    Cancer Paternal Grandfather     Social History Social History   Tobacco Use   Smoking status: Never   Smokeless tobacco: Never  Vaping Use   Vaping status: Never Used  Substance Use Topics  Alcohol use: Yes    Alcohol/week: 0.0 standard drinks of alcohol    Comment: sometimes   Drug use: Yes    Types: Marijuana    Comment: occasionally   Allergies Patient has no known allergies.  Review of Systems Review of Systems  Gastrointestinal:  Positive for abdominal pain, nausea and vomiting.    Physical Exam Vital Signs  I have reviewed the triage vital signs BP (!) 174/85 (BP Location: Left Arm)   Pulse 86   Temp 97.9 F (36.6 C) (Oral)   Resp 18   Ht 5\' 6"  (1.676 m)   Wt (!) 145 kg   SpO2 100%   BMI 51.60 kg/m   Physical Exam Vitals and nursing note reviewed.  Constitutional:      General: She is not in acute distress.    Appearance: She is well-developed.  HENT:     Head: Normocephalic and atraumatic.  Eyes:     Conjunctiva/sclera: Conjunctivae normal.  Cardiovascular:     Rate and Rhythm: Normal rate and regular rhythm.     Heart sounds: No murmur heard. Pulmonary:     Effort: Pulmonary effort is normal. No respiratory distress.     Breath sounds: Normal breath sounds.  Abdominal:     Palpations: Abdomen is soft.     Tenderness: There is abdominal tenderness in the epigastric  area.  Musculoskeletal:        General: No swelling.     Cervical back: Neck supple.  Skin:    General: Skin is warm and dry.     Capillary Refill: Capillary refill takes less than 2 seconds.  Neurological:     Mental Status: She is alert.  Psychiatric:        Mood and Affect: Mood normal.     ED Results and Treatments Labs (all labs ordered are listed, but only abnormal results are displayed) Labs Reviewed  COMPREHENSIVE METABOLIC PANEL - Abnormal; Notable for the following components:      Result Value   Glucose, Bld 122 (*)    Total Protein 8.3 (*)    ALT 52 (*)    All other components within normal limits  CBC - Abnormal; Notable for the following components:   WBC 14.7 (*)    Platelets 418 (*)    All other components within normal limits  LIPASE, BLOOD  HCG, SERUM, QUALITATIVE  URINALYSIS, ROUTINE W REFLEX MICROSCOPIC                                                                                                                          Radiology No results found.  Pertinent labs & imaging results that were available during my care of the patient were reviewed by me and considered in my medical decision making (see MDM for details).  Medications Ordered in ED Medications - No data to display  Procedures Procedures  (including critical care time)  Medical Decision Making / ED Course   This patient presents to the ED for concern of abdominal pain, hematemesis, this involves an extensive number of treatment options, and is a complaint that carries with it a high risk of complications and morbidity.  The differential diagnosis includes cholecystitis, cholelithiasis/biliary colic, choledocholithiasis, ascending cholangitis, acute hepatitis, pancreatitis, GERD, pneumonia, constipation, nephrolithiasis, Mallory-Weiss tear,  varices  MDM: Patient seen emergency room for evaluation of abdominal pain with mild hematemesis.  Physical exam with mild epigastric and right upper quadrant tenderness to palpation but is otherwise unremarkable.  Laboratory evaluation with a leukocytosis to 14.7, normal hemoglobin, platelet count 418, urinalysis unremarkable, lipase normal, ALT 52 but is otherwise unremarkable.  CT abdomen pelvis is negative for acute pathology today.  On my reevaluation, patient states that symptoms are mild and she has not had any additional episodes of vomiting here in the ER today.  Will prescribe Bentyl for intermittent abdominal spasms and patient's hematemesis consistent with Mallory-Weiss.  Will follow-up outpatient with her gastroenterologist and at this time she currently does not meet inpatient criteria for admission.  Return precautions given of which she voiced understanding she was discharged.   Additional history obtained: -Additional history obtained from partner -External records from outside source obtained and reviewed including: Chart review including previous notes, labs, imaging, consultation notes   Lab Tests: -I ordered, reviewed, and interpreted labs.   The pertinent results include:   Labs Reviewed  COMPREHENSIVE METABOLIC PANEL - Abnormal; Notable for the following components:      Result Value   Glucose, Bld 122 (*)    Total Protein 8.3 (*)    ALT 52 (*)    All other components within normal limits  CBC - Abnormal; Notable for the following components:   WBC 14.7 (*)    Platelets 418 (*)    All other components within normal limits  LIPASE, BLOOD  HCG, SERUM, QUALITATIVE  URINALYSIS, ROUTINE W REFLEX MICROSCOPIC    Imaging Studies ordered: I ordered imaging studies including CT abdomen pelvis I independently visualized and interpreted imaging. I agree with the radiologist interpretation   Medicines ordered and prescription drug management: No orders of the defined  types were placed in this encounter.   -I have reviewed the patients home medicines and have made adjustments as needed  Critical interventions none    Cardiac Monitoring: The patient was maintained on a cardiac monitor.  I personally viewed and interpreted the cardiac monitored which showed an underlying rhythm of: NSR  Social Determinants of Health:  Factors impacting patients care include: none   Reevaluation: After the interventions noted above, I reevaluated the patient and found that they have :improved  Co morbidities that complicate the patient evaluation  Past Medical History:  Diagnosis Date   Anxiety       Dispostion: I considered admission for this patient, but at this time she does not meet inpatient criteria for admission and will be discharged with outpatient follow-up     Final Clinical Impression(s) / ED Diagnoses Final diagnoses:  None     @PCDICTATION @    Glendora Score, MD 08/05/23 [phone]

## 2023-08-06 ENCOUNTER — Telehealth: Payer: Self-pay

## 2023-08-06 NOTE — Transitions of Care (Post Inpatient/ED Visit) (Signed)
   08/06/2023  Name: Kathy Hensley MRN: [id number] DOB: 1996/03/09  Today's TOC FU Call Status: Today's TOC FU Call Status:: Unsuccessful Call (1st Attempt) Unsuccessful Call (1st Attempt) Date: 08/06/23  Attempted to reach the patient regarding the most recent Inpatient/ED visit.  Follow Up Plan: Additional outreach attempts will be made to reach the patient to complete the Transitions of Care (Post Inpatient/ED visit) call.   Signature Arvil Persons, BSN, Charity fundraiser

## 2023-08-11 NOTE — Transitions of Care (Post Inpatient/ED Visit) (Signed)
   08/11/2023  Name: Kathy Hensley MRN: [id number] DOB: 1995/09/09  Today's TOC FU Call Status: Today's TOC FU Call Status:: Unsuccessful Call (2nd Attempt) Unsuccessful Call (1st Attempt) Date: 08/06/23 Unsuccessful Call (2nd Attempt) Date: 08/11/23  Attempted to reach the patient regarding the most recent Inpatient/ED visit.  Follow Up Plan: Additional outreach attempts will be made to reach the patient to complete the Transitions of Care (Post Inpatient/ED visit) call.   Signature Arvil Persons, BSN, Charity fundraiser

## 2023-08-12 DIAGNOSIS — F4010 Social phobia, unspecified: Secondary | ICD-10-CM | POA: Diagnosis not present

## 2023-08-12 DIAGNOSIS — F330 Major depressive disorder, recurrent, mild: Secondary | ICD-10-CM | POA: Diagnosis not present

## 2023-08-19 DIAGNOSIS — R1013 Epigastric pain: Secondary | ICD-10-CM | POA: Diagnosis not present

## 2023-08-19 DIAGNOSIS — R7989 Other specified abnormal findings of blood chemistry: Secondary | ICD-10-CM | POA: Diagnosis not present

## 2023-08-19 DIAGNOSIS — R112 Nausea with vomiting, unspecified: Secondary | ICD-10-CM | POA: Diagnosis not present

## 2023-08-23 NOTE — Transitions of Care (Post Inpatient/ED Visit) (Signed)
   08/23/2023  Name: Kathy Hensley MRN: [id number] DOB: September 16, 1995  Today's TOC FU Call Status: Today's TOC FU Call Status:: Unsuccessful Call (3rd Attempt) Unsuccessful Call (1st Attempt) Date: 08/06/23 Unsuccessful Call (2nd Attempt) Date: 08/11/23 Unsuccessful Call (3rd Attempt) Date: 08/23/23  Attempted to reach the patient regarding the most recent Inpatient/ED visit.  Follow Up Plan: No further outreach attempts will be made at this time. We have been unable to contact the patient.  Signature Arvil Persons, BSN, Charity fundraiser

## 2023-09-16 DIAGNOSIS — F330 Major depressive disorder, recurrent, mild: Secondary | ICD-10-CM | POA: Diagnosis not present

## 2023-09-16 DIAGNOSIS — F4010 Social phobia, unspecified: Secondary | ICD-10-CM | POA: Diagnosis not present

## 2023-09-23 DIAGNOSIS — F4010 Social phobia, unspecified: Secondary | ICD-10-CM | POA: Diagnosis not present

## 2023-09-23 DIAGNOSIS — F330 Major depressive disorder, recurrent, mild: Secondary | ICD-10-CM | POA: Diagnosis not present

## 2023-09-30 DIAGNOSIS — F4010 Social phobia, unspecified: Secondary | ICD-10-CM | POA: Diagnosis not present

## 2023-09-30 DIAGNOSIS — F330 Major depressive disorder, recurrent, mild: Secondary | ICD-10-CM | POA: Diagnosis not present

## 2023-10-07 DIAGNOSIS — F4010 Social phobia, unspecified: Secondary | ICD-10-CM | POA: Diagnosis not present

## 2023-10-07 DIAGNOSIS — F330 Major depressive disorder, recurrent, mild: Secondary | ICD-10-CM | POA: Diagnosis not present

## 2023-10-14 DIAGNOSIS — F4010 Social phobia, unspecified: Secondary | ICD-10-CM | POA: Diagnosis not present

## 2023-10-14 DIAGNOSIS — F330 Major depressive disorder, recurrent, mild: Secondary | ICD-10-CM | POA: Diagnosis not present

## 2023-10-21 DIAGNOSIS — F4010 Social phobia, unspecified: Secondary | ICD-10-CM | POA: Diagnosis not present

## 2023-10-21 DIAGNOSIS — F330 Major depressive disorder, recurrent, mild: Secondary | ICD-10-CM | POA: Diagnosis not present

## 2023-11-04 DIAGNOSIS — F4010 Social phobia, unspecified: Secondary | ICD-10-CM | POA: Diagnosis not present

## 2023-11-04 DIAGNOSIS — F330 Major depressive disorder, recurrent, mild: Secondary | ICD-10-CM | POA: Diagnosis not present

## 2023-11-04 DIAGNOSIS — F1220 Cannabis dependence, uncomplicated: Secondary | ICD-10-CM | POA: Diagnosis not present

## 2023-11-11 DIAGNOSIS — F1220 Cannabis dependence, uncomplicated: Secondary | ICD-10-CM | POA: Diagnosis not present

## 2023-11-11 DIAGNOSIS — F4010 Social phobia, unspecified: Secondary | ICD-10-CM | POA: Diagnosis not present

## 2023-11-11 DIAGNOSIS — F330 Major depressive disorder, recurrent, mild: Secondary | ICD-10-CM | POA: Diagnosis not present

## 2023-11-18 DIAGNOSIS — F4010 Social phobia, unspecified: Secondary | ICD-10-CM | POA: Diagnosis not present

## 2023-11-18 DIAGNOSIS — F1220 Cannabis dependence, uncomplicated: Secondary | ICD-10-CM | POA: Diagnosis not present

## 2023-11-18 DIAGNOSIS — F330 Major depressive disorder, recurrent, mild: Secondary | ICD-10-CM | POA: Diagnosis not present

## 2023-11-25 DIAGNOSIS — F1220 Cannabis dependence, uncomplicated: Secondary | ICD-10-CM | POA: Diagnosis not present

## 2023-11-25 DIAGNOSIS — F4010 Social phobia, unspecified: Secondary | ICD-10-CM | POA: Diagnosis not present

## 2023-11-25 DIAGNOSIS — F330 Major depressive disorder, recurrent, mild: Secondary | ICD-10-CM | POA: Diagnosis not present

## 2023-12-02 DIAGNOSIS — F1220 Cannabis dependence, uncomplicated: Secondary | ICD-10-CM | POA: Diagnosis not present

## 2023-12-02 DIAGNOSIS — F4010 Social phobia, unspecified: Secondary | ICD-10-CM | POA: Diagnosis not present

## 2023-12-02 DIAGNOSIS — F330 Major depressive disorder, recurrent, mild: Secondary | ICD-10-CM | POA: Diagnosis not present

## 2023-12-09 DIAGNOSIS — F1220 Cannabis dependence, uncomplicated: Secondary | ICD-10-CM | POA: Diagnosis not present

## 2023-12-09 DIAGNOSIS — F330 Major depressive disorder, recurrent, mild: Secondary | ICD-10-CM | POA: Diagnosis not present

## 2023-12-09 DIAGNOSIS — F4010 Social phobia, unspecified: Secondary | ICD-10-CM | POA: Diagnosis not present

## 2023-12-16 DIAGNOSIS — F330 Major depressive disorder, recurrent, mild: Secondary | ICD-10-CM | POA: Diagnosis not present

## 2023-12-16 DIAGNOSIS — F4010 Social phobia, unspecified: Secondary | ICD-10-CM | POA: Diagnosis not present

## 2023-12-16 DIAGNOSIS — F1220 Cannabis dependence, uncomplicated: Secondary | ICD-10-CM | POA: Diagnosis not present

## 2023-12-23 DIAGNOSIS — F1220 Cannabis dependence, uncomplicated: Secondary | ICD-10-CM | POA: Diagnosis not present

## 2023-12-23 DIAGNOSIS — F4010 Social phobia, unspecified: Secondary | ICD-10-CM | POA: Diagnosis not present

## 2023-12-23 DIAGNOSIS — F330 Major depressive disorder, recurrent, mild: Secondary | ICD-10-CM | POA: Diagnosis not present

## 2023-12-30 DIAGNOSIS — F4010 Social phobia, unspecified: Secondary | ICD-10-CM | POA: Diagnosis not present

## 2023-12-30 DIAGNOSIS — F330 Major depressive disorder, recurrent, mild: Secondary | ICD-10-CM | POA: Diagnosis not present

## 2023-12-30 DIAGNOSIS — F1220 Cannabis dependence, uncomplicated: Secondary | ICD-10-CM | POA: Diagnosis not present

## 2024-01-07 DIAGNOSIS — F1220 Cannabis dependence, uncomplicated: Secondary | ICD-10-CM | POA: Diagnosis not present

## 2024-01-07 DIAGNOSIS — F4010 Social phobia, unspecified: Secondary | ICD-10-CM | POA: Diagnosis not present

## 2024-01-07 DIAGNOSIS — F330 Major depressive disorder, recurrent, mild: Secondary | ICD-10-CM | POA: Diagnosis not present

## 2024-01-27 DIAGNOSIS — F330 Major depressive disorder, recurrent, mild: Secondary | ICD-10-CM | POA: Diagnosis not present

## 2024-01-27 DIAGNOSIS — F1220 Cannabis dependence, uncomplicated: Secondary | ICD-10-CM | POA: Diagnosis not present

## 2024-01-27 DIAGNOSIS — F4010 Social phobia, unspecified: Secondary | ICD-10-CM | POA: Diagnosis not present

## 2024-02-03 DIAGNOSIS — F1220 Cannabis dependence, uncomplicated: Secondary | ICD-10-CM | POA: Diagnosis not present

## 2024-02-03 DIAGNOSIS — F330 Major depressive disorder, recurrent, mild: Secondary | ICD-10-CM | POA: Diagnosis not present

## 2024-02-03 DIAGNOSIS — F4010 Social phobia, unspecified: Secondary | ICD-10-CM | POA: Diagnosis not present

## 2024-02-10 DIAGNOSIS — F4010 Social phobia, unspecified: Secondary | ICD-10-CM | POA: Diagnosis not present

## 2024-02-10 DIAGNOSIS — F1220 Cannabis dependence, uncomplicated: Secondary | ICD-10-CM | POA: Diagnosis not present

## 2024-02-10 DIAGNOSIS — F330 Major depressive disorder, recurrent, mild: Secondary | ICD-10-CM | POA: Diagnosis not present

## 2024-02-17 DIAGNOSIS — F4010 Social phobia, unspecified: Secondary | ICD-10-CM | POA: Diagnosis not present

## 2024-02-17 DIAGNOSIS — F1220 Cannabis dependence, uncomplicated: Secondary | ICD-10-CM | POA: Diagnosis not present

## 2024-02-17 DIAGNOSIS — F330 Major depressive disorder, recurrent, mild: Secondary | ICD-10-CM | POA: Diagnosis not present

## 2024-02-22 DIAGNOSIS — F4010 Social phobia, unspecified: Secondary | ICD-10-CM | POA: Diagnosis not present

## 2024-02-22 DIAGNOSIS — F330 Major depressive disorder, recurrent, mild: Secondary | ICD-10-CM | POA: Diagnosis not present

## 2024-02-22 DIAGNOSIS — F1220 Cannabis dependence, uncomplicated: Secondary | ICD-10-CM | POA: Diagnosis not present

## 2024-03-01 DIAGNOSIS — F330 Major depressive disorder, recurrent, mild: Secondary | ICD-10-CM | POA: Diagnosis not present

## 2024-03-01 DIAGNOSIS — F1220 Cannabis dependence, uncomplicated: Secondary | ICD-10-CM | POA: Diagnosis not present

## 2024-03-01 DIAGNOSIS — F4010 Social phobia, unspecified: Secondary | ICD-10-CM | POA: Diagnosis not present

## 2024-03-06 DIAGNOSIS — F4010 Social phobia, unspecified: Secondary | ICD-10-CM | POA: Diagnosis not present

## 2024-03-06 DIAGNOSIS — F330 Major depressive disorder, recurrent, mild: Secondary | ICD-10-CM | POA: Diagnosis not present

## 2024-03-06 DIAGNOSIS — F1220 Cannabis dependence, uncomplicated: Secondary | ICD-10-CM | POA: Diagnosis not present

## 2024-03-23 DIAGNOSIS — F330 Major depressive disorder, recurrent, mild: Secondary | ICD-10-CM | POA: Diagnosis not present

## 2024-03-23 DIAGNOSIS — F4010 Social phobia, unspecified: Secondary | ICD-10-CM | POA: Diagnosis not present

## 2024-03-23 DIAGNOSIS — F1220 Cannabis dependence, uncomplicated: Secondary | ICD-10-CM | POA: Diagnosis not present

## 2024-03-27 ENCOUNTER — Other Ambulatory Visit: Payer: Self-pay | Admitting: Internal Medicine

## 2024-03-27 DIAGNOSIS — F4010 Social phobia, unspecified: Secondary | ICD-10-CM

## 2024-03-27 NOTE — Telephone Encounter (Signed)
 Last Ov 07/01/23 Filled 07/06/23

## 2024-03-30 DIAGNOSIS — F1220 Cannabis dependence, uncomplicated: Secondary | ICD-10-CM | POA: Diagnosis not present

## 2024-03-30 DIAGNOSIS — F330 Major depressive disorder, recurrent, mild: Secondary | ICD-10-CM | POA: Diagnosis not present

## 2024-03-30 DIAGNOSIS — F4010 Social phobia, unspecified: Secondary | ICD-10-CM | POA: Diagnosis not present

## 2024-04-11 ENCOUNTER — Telehealth: Admitting: Family Medicine

## 2024-04-11 DIAGNOSIS — M549 Dorsalgia, unspecified: Secondary | ICD-10-CM | POA: Diagnosis not present

## 2024-04-11 MED ORDER — NAPROXEN 500 MG PO TABS: 500.0000 mg | ORAL_TABLET | Freq: Two times a day (BID) | ORAL | 0 refills | Status: AC

## 2024-04-11 MED ORDER — CYCLOBENZAPRINE HCL 10 MG PO TABS: 10.0000 mg | ORAL_TABLET | Freq: Three times a day (TID) | ORAL | 0 refills | Status: AC | PRN

## 2024-04-11 NOTE — Progress Notes (Signed)
 E-Visit for Back Pain   We are sorry that you are not feeling well.  Here is how we plan to help!  Based on what you have shared with me it looks like you mostly have acute back pain.  Acute back pain is defined as musculoskeletal pain that can resolve in 1-3 weeks with conservative treatment.  I have prescribed Naprosyn 500 mg take one by mouth twice a day non-steroid anti-inflammatory (NSAID) as well as Flexeril 10 mg every eight hours as needed which is a muscle relaxer  Some patients experience stomach irritation or in increased heartburn with anti-inflammatory drugs.  Please keep in mind that muscle relaxer's can cause fatigue and should not be taken while at work or driving.  Back pain is very common.  The pain often gets better over time.  The cause of back pain is usually not dangerous.  Most people can learn to manage their back pain on their own.  Home Care Stay active.  Start with short walks on flat ground if you can.  Try to walk farther each day. Do not sit, drive or stand in one place for more than 30 minutes.  Do not stay in bed. Do not avoid exercise or work.  Activity can help your back heal faster. Be careful when you bend or lift an object.  Bend at your knees, keep the object close to you, and do not twist. Sleep on a firm mattress.  Lie on your side, and bend your knees.  If you lie on your back, put a pillow under your knees. Only take medicines as told by your doctor. Put ice on the injured area. Put ice in a plastic bag Place a towel between your skin and the bag Leave the ice on for 15-20 minutes, 3-4 times a day for the first 2-3 days. 210 After that, you can switch between ice and heat packs. Ask your doctor about back exercises or massage. Avoid feeling anxious or stressed.  Find good ways to deal with stress, such as exercise.  Get Help Right Way If: Your pain does not go away with rest or medicine. Your pain does not go away in 1 week. You have new  problems. You do not feel well. The pain spreads into your legs. You cannot control when you poop (bowel movement) or pee (urinate) You feel sick to your stomach (nauseous) or throw up (vomit) You have belly (abdominal) pain. You feel like you may pass out (faint). If you develop a fever.  Make Sure you: Understand these instructions. Will watch your condition Will get help right away if you are not doing well or get worse.  Your e-visit answers were reviewed by a board certified advanced clinical practitioner to complete your personal care plan.  Depending on the condition, your plan could have included both over the counter or prescription medications.  If there is a problem please reply  once you have received a response from your provider.  Your safety is important to us .  If you have drug allergies check your prescription carefully.    You can use MyChart to ask questions about today's visit, request a non-urgent call back, or ask for a work or school excuse for 24 hours related to this e-Visit. If it has been greater than 24 hours you will need to follow up with your provider, or enter a new e-Visit to address those concerns.  You will get an e-mail in the next two days asking about  your experience.  I hope that your e-visit has been valuable and will speed your recovery. Thank you for using e-visits.

## 2024-04-11 NOTE — Progress Notes (Signed)
 I have spent 5 minutes in review of e-visit questionnaire, review and updating patient chart, medical decision making and response to patient.   Elsie Velma Lunger, PA-C

## 2024-04-13 ENCOUNTER — Ambulatory Visit: Admitting: Internal Medicine

## 2024-04-13 DIAGNOSIS — F1220 Cannabis dependence, uncomplicated: Secondary | ICD-10-CM | POA: Diagnosis not present

## 2024-04-13 DIAGNOSIS — F330 Major depressive disorder, recurrent, mild: Secondary | ICD-10-CM | POA: Diagnosis not present

## 2024-04-13 DIAGNOSIS — F4010 Social phobia, unspecified: Secondary | ICD-10-CM | POA: Diagnosis not present

## 2024-04-25 ENCOUNTER — Other Ambulatory Visit: Payer: Self-pay | Admitting: Internal Medicine

## 2024-04-25 DIAGNOSIS — F331 Major depressive disorder, recurrent, moderate: Secondary | ICD-10-CM

## 2024-04-27 DIAGNOSIS — F4010 Social phobia, unspecified: Secondary | ICD-10-CM | POA: Diagnosis not present

## 2024-04-27 DIAGNOSIS — F1220 Cannabis dependence, uncomplicated: Secondary | ICD-10-CM | POA: Diagnosis not present

## 2024-04-27 DIAGNOSIS — F330 Major depressive disorder, recurrent, mild: Secondary | ICD-10-CM | POA: Diagnosis not present

## 2024-05-11 DIAGNOSIS — F3340 Major depressive disorder, recurrent, in remission, unspecified: Secondary | ICD-10-CM | POA: Diagnosis not present

## 2024-05-11 DIAGNOSIS — F1220 Cannabis dependence, uncomplicated: Secondary | ICD-10-CM | POA: Diagnosis not present

## 2024-05-11 DIAGNOSIS — F4010 Social phobia, unspecified: Secondary | ICD-10-CM | POA: Diagnosis not present

## 2024-05-17 DIAGNOSIS — Z23 Encounter for immunization: Secondary | ICD-10-CM | POA: Diagnosis not present

## 2024-05-25 DIAGNOSIS — F4010 Social phobia, unspecified: Secondary | ICD-10-CM | POA: Diagnosis not present

## 2024-05-25 DIAGNOSIS — F1220 Cannabis dependence, uncomplicated: Secondary | ICD-10-CM | POA: Diagnosis not present

## 2024-05-25 DIAGNOSIS — F3340 Major depressive disorder, recurrent, in remission, unspecified: Secondary | ICD-10-CM | POA: Diagnosis not present

## 2024-06-08 DIAGNOSIS — F1220 Cannabis dependence, uncomplicated: Secondary | ICD-10-CM | POA: Diagnosis not present

## 2024-06-08 DIAGNOSIS — F3340 Major depressive disorder, recurrent, in remission, unspecified: Secondary | ICD-10-CM | POA: Diagnosis not present

## 2024-06-08 DIAGNOSIS — F4010 Social phobia, unspecified: Secondary | ICD-10-CM | POA: Diagnosis not present

## 2024-06-14 DIAGNOSIS — Z3046 Encounter for surveillance of implantable subdermal contraceptive: Secondary | ICD-10-CM | POA: Diagnosis not present

## 2024-06-22 DIAGNOSIS — F1220 Cannabis dependence, uncomplicated: Secondary | ICD-10-CM | POA: Diagnosis not present

## 2024-06-22 DIAGNOSIS — F4010 Social phobia, unspecified: Secondary | ICD-10-CM | POA: Diagnosis not present

## 2024-06-22 DIAGNOSIS — F3340 Major depressive disorder, recurrent, in remission, unspecified: Secondary | ICD-10-CM | POA: Diagnosis not present

## 2024-07-02 ENCOUNTER — Other Ambulatory Visit: Payer: Self-pay | Admitting: Internal Medicine

## 2024-07-02 DIAGNOSIS — F4010 Social phobia, unspecified: Secondary | ICD-10-CM

## 2024-07-03 NOTE — Telephone Encounter (Signed)
 LOV 07/01/2023 FOV not one scheduled  LRF 03/27/2024

## 2024-07-20 DIAGNOSIS — F1220 Cannabis dependence, uncomplicated: Secondary | ICD-10-CM | POA: Diagnosis not present

## 2024-07-20 DIAGNOSIS — F3340 Major depressive disorder, recurrent, in remission, unspecified: Secondary | ICD-10-CM | POA: Diagnosis not present

## 2024-07-20 DIAGNOSIS — F4010 Social phobia, unspecified: Secondary | ICD-10-CM | POA: Diagnosis not present

## 2024-09-06 ENCOUNTER — Ambulatory Visit: Admitting: Internal Medicine

## 2024-09-06 ENCOUNTER — Encounter: Payer: Self-pay | Admitting: Internal Medicine

## 2024-09-06 VITALS — BP 132/80 | HR 80 | Temp 98.7°F | Ht 66.0 in | Wt 331.2 lb

## 2024-09-06 DIAGNOSIS — F338 Other recurrent depressive disorders: Secondary | ICD-10-CM | POA: Insufficient documentation

## 2024-09-06 DIAGNOSIS — F4010 Social phobia, unspecified: Secondary | ICD-10-CM | POA: Diagnosis not present

## 2024-09-06 DIAGNOSIS — F331 Major depressive disorder, recurrent, moderate: Secondary | ICD-10-CM | POA: Diagnosis not present

## 2024-09-06 MED ORDER — FLUOXETINE HCL 10 MG PO CAPS: 10.0000 mg | ORAL_CAPSULE | Freq: Every day | ORAL | 3 refills | Status: AC

## 2024-09-06 MED ORDER — FLUOXETINE HCL 20 MG PO CAPS: 20.0000 mg | ORAL_CAPSULE | Freq: Every day | ORAL | 3 refills | Status: AC

## 2024-09-06 NOTE — Progress Notes (Addendum)
 " [facility] [address] [place] [address] Dept: [phone] Dept Fax: [phone]    Subjective:   Kathy Hensley 1996/07/09 09/06/2024  Chief Complaint  Patient presents with   Medication Refill    HPI: [name] presents today for re-assessment and management of chronic medical conditions.  Discussed the use of AI scribe software for clinical note transcription with the patient, who gave verbal consent to proceed.  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  Her anxiety and depression have improved overall since the last visit, although she is currently experiencing a 'rough patch' due to seasonal depression, which exacerbates her anxiety. No SI/HI.   She is currently taking fluoxetine  at a dose of 20 mg and 10 mg daily, and bupropion  at 300 mg daily.  Patient reports that her fianc saw a yellowish/grayish tent to the sides of her eyes over this past weekend.  She just wants this evaluated.  She thinks it may have been the lighting that caused the possible discoloration that the fianc noticed.  Denies vision changes.  She wears contacts and has not changed her prescription recently.  She does have a follow-up upcoming with her eye doctor.        09/06/2024    4:20 PM 07/01/2023    3:15 PM 03/23/2023   10:09 AM  Depression screen PHQ 2/9  Decreased Interest 2 1 1   Down, Depressed, Hopeless 1 1 2   PHQ - 2 Score 3 2 3   Altered sleeping 2  0  Tired, decreased energy 1  1  Change in appetite 1  2  Feeling bad or failure about yourself  0  1  Trouble concentrating 1  1  Moving slowly or fidgety/restless 0  0  Suicidal thoughts 0  0  PHQ-9 Score 8  8   Difficult doing work/chores Somewhat difficult  Somewhat difficult     Data saved with a  previous flowsheet row definition      09/06/2024    4:20 PM 03/23/2023   10:10 AM  GAD 7 : Generalized Anxiety Score  Nervous, Anxious, on Edge 3 3  Control/stop worrying 1 3  Worry too much - different things 1 2  Trouble relaxing 1 2  Restless 0 2  Easily annoyed or irritable 0 2  Afraid - awful might happen 0 1  Total GAD 7 Score 6 15  Anxiety Difficulty Very difficult Very difficult     The following portions of the patient's history were reviewed and updated as appropriate: past medical history, past surgical history, family history, social history, allergies, medications, and problem list.   Patient Active Problem List   Diagnosis Date Noted   Seasonal affective disorder 09/06/2024   Social anxiety disorder 03/23/2023   Major depression, recurrent 03/23/2023   Nervous indigestion 03/23/2023   Nexplanon  in place 12/20/2019   Morbid obesity (HCC) 10/04/2017   Past Medical History:  Diagnosis Date   Anxiety    Past Surgical History:  Procedure Laterality Date   FRACTURE SURGERY Right 2008   foot   Nexplanon   06/01/2018   TONSILLECTOMY     WISDOM TOOTH EXTRACTION     Family History  Problem Relation Age of Onset   Hypertension Mother    Diabetes Father    Hypertension Father    Heart  disease Father    Hyperlipidemia Father    Breast cancer Paternal Aunt    Diabetes Maternal Grandmother    Cancer Maternal Grandfather        Colon Cancer   Hyperlipidemia Paternal Grandmother    Cancer Paternal Grandfather    Current Medications[1] Allergies[2]   ROS: A complete ROS was performed with pertinent positives/negatives noted in the HPI. The remainder of the ROS are negative.    Objective:   Today's Vitals   09/06/24 1553  BP: 132/80  Pulse: 80  Temp: 98.7 F (37.1 C)  TempSrc: Temporal  SpO2: 99%  Weight: (!) 331 lb 3.2 oz (150.2 kg)  Height: 5' 6 (1.676 m)    GENERAL: Well-appearing, in NAD. Well nourished.  SKIN: Pink, warm and dry. No rash,  lesion, ulceration, or ecchymoses.  HEENT:    HEAD: Normocephalic, non-traumatic.  EYES: Conjunctive pink without exudate. PERRL, EOMI.  NECK: Trachea midline. Full ROM w/o pain or tenderness. No lymphadenopathy.  RESPIRATORY: Chest wall symmetrical. Respirations even and non-labored. Breath sounds clear to auscultation bilaterally.  CARDIAC: S1, S2 present, regular rate and rhythm. Peripheral pulses 2+ bilaterally.  EXTREMITIES: Without clubbing, cyanosis, or edema.  NEUROLOGIC:  Steady, even gait.  PSYCH/MENTAL STATUS: Alert, oriented x 3. Cooperative, appropriate mood and affect.   Health Maintenance Due  Topic Date Due   HIV Screening  Never done   Hepatitis C Screening  Never done   Hepatitis B Vaccines 19-59 Average Risk (1 of 3 - 19+ 3-dose series) Never done    No results found for any visits on 09/06/24.  The ASCVD Risk score (Arnett DK, et al., 2019) failed to calculate for the following reasons:   The 2019 ASCVD risk score is only valid for ages 42 to 62   * - Cholesterol units were assumed     Assessment & Plan:  Assessment and Plan    Recurrent major depressive disorder with seasonal affective features Currently well-controlled with fluoxetine  and bupropion . Improvement noted compared to previous.  Seasonal affective disorder likely exacerbating anxiety. - Prescribed fluoxetine  20 mg and 10 mg capsules to achieve a total of 30 mg daily. - Continue bupropion  XL 300 mg once daily.  Social anxiety disorder Well-controlled with current medication regimen. - Continue current medication regimen.  General health maintenance Immunizations up to date, including HPV and tetanus. - Schedule a physical examination in six months, including fasting blood work.       No orders of the defined types were placed in this encounter.  No images are attached to the encounter or orders placed in the encounter. Meds ordered this encounter  Medications   FLUoxetine  (PROZAC ) 20 MG  capsule    Sig: Take 1 capsule (20 mg total) by mouth daily.    Dispense:  90 capsule    Refill:  3    Supervising Provider:   THOMPSON, AARON B [[id number]]   FLUoxetine  (PROZAC ) 10 MG capsule    Sig: Take 1 capsule (10 mg total) by mouth daily.    Dispense:  90 capsule    Refill:  3    Supervising Provider:   SEBASTIAN BEVERLEY NOVAK [[id number]]    Return in about 6 months (around 03/06/2025) for Annual Physical Exam with fasting lab work.   Rosina Senters, FNP     [1]  Current Outpatient Medications:    buPROPion  (WELLBUTRIN  XL) 300 MG 24 hr tablet, TAKE 1 TABLET(300 MG) BY MOUTH DAILY, Disp: 90 tablet, Rfl: 3  cyclobenzaprine  (FLEXERIL ) 10 MG tablet, Take 1 tablet (10 mg total) by mouth 3 (three) times daily as needed., Disp: 15 tablet, Rfl: 0   dicyclomine  (BENTYL ) 20 MG tablet, Take 1 tablet (20 mg total) by mouth 2 (two) times daily as needed for spasms., Disp: 20 tablet, Rfl: 0   FLUoxetine  (PROZAC ) 10 MG capsule, Take 1 capsule (10 mg total) by mouth daily., Disp: 90 capsule, Rfl: 3   FLUoxetine  (PROZAC ) 20 MG capsule, Take 1 capsule (20 mg total) by mouth daily., Disp: 90 capsule, Rfl: 3   ibuprofen (ADVIL,MOTRIN) 100 MG tablet, Take 100 mg by mouth every 6 (six) hours as needed for fever., Disp: , Rfl:    naproxen  (NAPROSYN ) 500 MG tablet, Take 1 tablet (500 mg total) by mouth 2 (two) times daily with a meal., Disp: 20 tablet, Rfl: 0   ondansetron  (ZOFRAN -ODT) 4 MG disintegrating tablet, Take 1 tablet (4 mg total) by mouth every 8 (eight) hours as needed for nausea or vomiting., Disp: 20 tablet, Rfl: 0   Omeprazole 20 MG TBDD, Take 40 mg by mouth daily. (Patient not taking: Reported on 09/06/2024), Disp: , Rfl:  [2] No Known Allergies  "

## 2025-03-06 ENCOUNTER — Encounter: Admitting: Internal Medicine
